# Patient Record
Sex: Male | Born: 1935 | Race: Black or African American | Hispanic: No | Marital: Married | Smoking: Former smoker
Health system: Southern US, Community
[De-identification: ages and names within clinical notes are randomized; demographics above are authoritative.]

## PROBLEM LIST (undated history)

## (undated) DIAGNOSIS — E119 Type 2 diabetes mellitus without complications: Secondary | ICD-10-CM

## (undated) DIAGNOSIS — I10 Essential (primary) hypertension: Secondary | ICD-10-CM

## (undated) DIAGNOSIS — E079 Disorder of thyroid, unspecified: Secondary | ICD-10-CM

## (undated) DIAGNOSIS — I2510 Atherosclerotic heart disease of native coronary artery without angina pectoris: Secondary | ICD-10-CM

## (undated) DIAGNOSIS — I4891 Unspecified atrial fibrillation: Secondary | ICD-10-CM

## (undated) DIAGNOSIS — C9110 Chronic lymphocytic leukemia of B-cell type not having achieved remission: Secondary | ICD-10-CM

## (undated) HISTORY — PX: CHOLECYSTECTOMY: SHX55

---

## 2017-10-27 ENCOUNTER — Ambulatory Visit: Payer: Self-pay

## 2017-12-08 ENCOUNTER — Encounter: Payer: Self-pay | Admitting: *Deleted

## 2017-12-08 ENCOUNTER — Encounter: Payer: Non-veteran care | Attending: Cardiology | Admitting: *Deleted

## 2017-12-08 VITALS — Ht 69.5 in | Wt 156.0 lb

## 2017-12-08 DIAGNOSIS — I10 Essential (primary) hypertension: Secondary | ICD-10-CM

## 2017-12-08 DIAGNOSIS — I255 Ischemic cardiomyopathy: Secondary | ICD-10-CM | POA: Insufficient documentation

## 2017-12-08 DIAGNOSIS — C9110 Chronic lymphocytic leukemia of B-cell type not having achieved remission: Secondary | ICD-10-CM | POA: Insufficient documentation

## 2017-12-08 DIAGNOSIS — E785 Hyperlipidemia, unspecified: Secondary | ICD-10-CM

## 2017-12-08 DIAGNOSIS — Z9049 Acquired absence of other specified parts of digestive tract: Secondary | ICD-10-CM

## 2017-12-08 DIAGNOSIS — I4891 Unspecified atrial fibrillation: Secondary | ICD-10-CM

## 2017-12-08 DIAGNOSIS — Z951 Presence of aortocoronary bypass graft: Secondary | ICD-10-CM | POA: Insufficient documentation

## 2017-12-08 DIAGNOSIS — Z955 Presence of coronary angioplasty implant and graft: Secondary | ICD-10-CM

## 2017-12-08 NOTE — Progress Notes (Signed)
Daily Session Note  Patient Details  Name: Jared Castaneda MRN: [id number] Date of Birth: 03-22-1929 Referring Provider:     Cardiac Rehab from 12/08/2017 in [facility]  Referring Provider  [place]      Encounter Date: 12/08/2017  Check In: Session Check In - 12/08/17 1436      Check-In   Location  [facility]    Staff Present  Renita Papa, RN Vickki Hearing, BA, ACSM CEP, Exercise Physiologist    Supervising physician immediately available to respond to emergencies  See telemetry face sheet for immediately available ER MD    Medication changes reported      No    Fall or balance concerns reported     No    Warm-up and Cool-down  Performed as group-led instruction    Resistance Training Performed  Yes    VAD Patient?  No      Pain Assessment   Currently in Pain?  No/denies        Exercise Prescription Changes - 12/08/17 1500      Response to Exercise   Blood Pressure (Admit)  112/64    Blood Pressure (Exercise)  136/56    Heart Rate (Admit)  71 bpm    Heart Rate (Exercise)  93 bpm    Heart Rate (Exit)  69 bpm    Oxygen Saturation (Admit)  98 %    Oxygen Saturation (Exercise)  92 %    Rating of Perceived Exertion (Exercise)  13       Social History   Tobacco Use  Smoking Status Former Smoker  . Last attempt to quit: 1984  . Years since quitting: 35.1  Smokeless Tobacco Never Used    Goals Met:  Proper associated with RPD/PD & O2 Sat Exercise tolerated well Personal goals reviewed No report of cardiac concerns or symptoms Strength training completed today  Goals Unmet:  Not Applicable  Comments: Med Review completed    Dr. Emily Filbert is Medical Director for [facility].

## 2017-12-08 NOTE — Progress Notes (Signed)
Cardiac Individual Treatment Plan  Patient Details  Name: Jared Castaneda MRN: [id number] Date of Birth: [birth year] Referring Provider:     Cardiac Rehab from 12/08/2017 in [facility] Cardiac and Pulmonary Rehab  Referring Provider  [place]      Initial Encounter Date:    Cardiac Rehab from 12/08/2017 in [facility] Cardiac and Pulmonary Rehab  Date  12/08/17  Referring Provider  Lawana Pai      Visit Diagnosis: Status post coronary artery stent placement  Hypertension, unspecified type  Dyslipidemia  Atrial fibrillation, unspecified type ([place])  CLL (chronic lymphocytic leukemia) ([place])  History of cholecystectomy  Hx of CABG  Ischemic cardiomyopathy  Patient's Home Medications on Admission:  Current Outpatient Medications:  .  acetaminophen (TYLENOL) 325 MG tablet, Take 650 mg by mouth every 6 (six) hours as needed., Disp: , Rfl:  .  apixaban (ELIQUIS) 5 MG TABS tablet, Take 5 mg by mouth 2 (two) times daily., Disp: , Rfl:  .  atorvastatin (LIPITOR) 80 MG tablet, Take 80 mg by mouth at bedtime., Disp: , Rfl:  .  carvedilol (COREG) 6.25 MG tablet, Take 6.25 mg by mouth 2 (two) times daily with a meal., Disp: , Rfl:  .  clopidogrel (PLAVIX) 75 MG tablet, Take 75 mg by mouth daily., Disp: , Rfl:  .  docusate sodium (COLACE) 100 MG capsule, Take 100 mg by mouth daily., Disp: , Rfl:  .  furosemide (LASIX) 20 MG tablet, Take 20 mg by mouth 2 (two) times daily as needed for fluid ("for Weigth Gain of more than 3 pounds or shortness of breath)., Disp: , Rfl:  .  magnesium oxide (MAG-OX) 400 MG tablet, Take 400 mg by mouth daily., Disp: , Rfl:  .  nitroGLYCERIN (NITROSTAT) 0.4 MG SL tablet, Place 0.4 mg under the tongue every 5 (five) minutes as needed for chest pain., Disp: , Rfl:   Past Medical History: History reviewed. No pertinent past medical history.  Tobacco Use: Social History   Tobacco Use  Smoking Status Former Smoker  . Last attempt to quit: 1984  . Years since quitting: 35.1   Smokeless Tobacco Never Used    Labs: Recent Review Flowsheet Data    There is no flowsheet data to display.       Exercise Target Goals: Date: 12/08/17  Exercise Program Goal: Individual exercise prescription set using results from initial 6 min walk test and THRR while considering  patient's activity barriers and safety.   Exercise Prescription Goal: Initial exercise prescription builds to 30-45 minutes a day of aerobic activity, 2-3 days per week.  Home exercise guidelines will be given to patient during program as part of exercise prescription that the participant will acknowledge.  Activity Barriers & Risk Stratification: Activity Barriers & Cardiac Risk Stratification - 12/08/17 1514      Activity Barriers & Cardiac Risk Stratification   Activity Barriers  Arthritis;Shortness of Breath    Cardiac Risk Stratification  High       6 Minute Walk: 6 Minute Walk    Row Name 12/08/17 1308         6 Minute Walk   Phase  Initial     Distance  742 feet     Walk Time  6 minutes     # of Rest Breaks  0     MPH  1.4     METS  1.36     RPE  13     Perceived Dyspnea   1  VO2 Peak  4.76     Symptoms  Yes (comment)     Comments  2/10 hip arthritis  used his cane     Resting HR  59 bpm     Resting BP  112/64     Resting Oxygen Saturation   98 % Room air     Exercise Oxygen Saturation  during 6 min walk  92 % Room air     Max Ex. BP  136/56        Oxygen Initial Assessment:   Oxygen Re-Evaluation:   Oxygen Discharge (Final Oxygen Re-Evaluation):   Initial Exercise Prescription: Initial Exercise Prescription - 12/08/17 1400      Date of Initial Exercise RX and Referring Provider   Date  12/08/17    Referring Provider  P[place]oy      Treadmill   MPH  1    Grade  0    Minutes  15      NuStep   Level  1    SPM  80    Minutes  15    METs  1.3      Biostep-RELP   Level  1    SPM  50    Minutes  15    METs  1.3      Track   Laps  11    Minutes   15    METs  1.5      Prescription Details   Frequency (times per week)  3    Duration  Progress to 30 minutes of continuous aerobic without signs/symptoms of physical distress      Intensity   THRR 40-80% of Max Heartrate  89-119    Ratings of Perceived Exertion  11-13    Perceived Dyspnea  0-4      Resistance Training   Training Prescription  Yes    Weight  2 lb    Reps  10-15       Perform Capillary Blood Glucose checks as needed.  Exercise Prescription Changes: Exercise Prescription Changes    Row Name 12/08/17 1500             Response to Exercise   Blood Pressure (Admit)  112/64       Blood Pressure (Exercise)  136/56       Heart Rate (Admit)  71 bpm       Heart Rate (Exercise)  93 bpm       Heart Rate (Exit)  69 bpm       Oxygen Saturation (Admit)  98 %       Oxygen Saturation (Exercise)  92 %       Rating of Perceived Exertion (Exercise)  13          Exercise Comments:   Exercise Goals and Review: Exercise Goals    Row Name 12/08/17 1436             Exercise Goals   Increase Physical Activity  Yes       Intervention  Provide advice, education, support and counseling about physical activity/exercise needs.;Develop an individualized exercise prescription for aerobic and resistive training based on initial evaluation findings, risk stratification, comorbidities and participant's personal goals.       Expected Outcomes  Short Term: Attend rehab on a regular basis to increase amount of physical activity.;Long Term: Add in home exercise to make exercise part of routine and to increase amount of physical activity.;Long Term: Exercising regularly at least 3-5 days a  week.       Increase Strength and Stamina  Yes       Intervention  Provide advice, education, support and counseling about physical activity/exercise needs.;Develop an individualized exercise prescription for aerobic and resistive training based on initial evaluation findings, risk stratification,  comorbidities and participant's personal goals.       Expected Outcomes  Short Term: Increase workloads from initial exercise prescription for resistance, speed, and METs.;Short Term: Perform resistance training exercises routinely during rehab and add in resistance training at home;Long Term: Improve cardiorespiratory fitness, muscular endurance and strength as measured by increased METs and functional capacity (6MWT)       Able to understand and use rate of perceived exertion (RPE) scale  Yes       Intervention  Provide education and explanation on how to use RPE scale       Expected Outcomes  Short Term: Able to use RPE daily in rehab to express subjective intensity level;Long Term:  Able to use RPE to guide intensity level when exercising independently       Able to understand and use Dyspnea scale  Yes       Intervention  Provide education and explanation on how to use Dyspnea scale       Expected Outcomes  Short Term: Able to use Dyspnea scale daily in rehab to express subjective sense of shortness of breath during exertion;Long Term: Able to use Dyspnea scale to guide intensity level when exercising independently       Knowledge and understanding of Target Heart Rate Range (THRR)  Yes       Intervention  Provide education and explanation of THRR including how the numbers were predicted and where they are located for reference       Expected Outcomes  Long Term: Able to use THRR to govern intensity when exercising independently;Short Term: Able to state/look up THRR;Short Term: Able to use daily as guideline for intensity in rehab       Able to check pulse independently  Yes       Intervention  Provide education and demonstration on how to check pulse in carotid and radial arteries.;Review the importance of being able to check your own pulse for safety during independent exercise       Expected Outcomes  Short Term: Able to explain why pulse checking is important during independent exercise;Long  Term: Able to check pulse independently and accurately       Understanding of Exercise Prescription  Yes       Intervention  Provide education, explanation, and written materials on patient's individual exercise prescription       Expected Outcomes  Short Term: Able to explain program exercise prescription;Long Term: Able to explain home exercise prescription to exercise independently          Exercise Goals Re-Evaluation :   Discharge Exercise Prescription (Final Exercise Prescription Changes): Exercise Prescription Changes - 12/08/17 1500      Response to Exercise   Blood Pressure (Admit)  112/64    Blood Pressure (Exercise)  136/56    Heart Rate (Admit)  71 bpm    Heart Rate (Exercise)  93 bpm    Heart Rate (Exit)  69 bpm    Oxygen Saturation (Admit)  98 %    Oxygen Saturation (Exercise)  92 %    Rating of Perceived Exertion (Exercise)  13       Nutrition:  Target Goals: Understanding of nutrition guidelines, daily intake of sodium <  $'1500mg'C$ , cholesterol '200mg'$ , calories 30% from fat and 7% or less from saturated fats, daily to have 5 or more servings of fruits and vegetables.  Biometrics: Pre Biometrics - 12/08/17 1435      Pre Biometrics   Height  5' 9.5" (1.765 m)    Weight  156 lb (70.8 kg)    Waist Circumference  36.5 inches    Hip Circumference  39.5 inches    Waist to Hip Ratio  0.92 %    BMI (Calculated)  22.71        Nutrition Therapy Plan and Nutrition Goals: Nutrition Therapy & Goals - 12/08/17 1507      Intervention Plan   Intervention  Prescribe, educate and counsel regarding individualized specific dietary modifications aiming towards targeted core components such as weight, hypertension, lipid management, diabetes, heart failure and other comorbidities.;Nutrition handout(s) given to patient.    Expected Outcomes  Short Term Goal: Understand basic principles of dietary content, such as calories, fat, sodium, cholesterol and nutrients.;Short Term Goal: A  plan has been developed with personal nutrition goals set during dietitian appointment.;Long Term Goal: Adherence to prescribed nutrition plan.       Nutrition Assessments: Nutrition Assessments - 12/08/17 1507      MEDFICTS Scores   Pre Score  -- will bring first day of class       Nutrition Goals Re-Evaluation:   Nutrition Goals Discharge (Final Nutrition Goals Re-Evaluation):   Psychosocial: Target Goals: Acknowledge presence or absence of significant depression and/or stress, maximize coping skills, provide positive support system. Participant is able to verbalize types and ability to use techniques and skills needed for reducing stress and depression.   Initial Review & Psychosocial Screening: Initial Psych Review & Screening - 12/08/17 1508      Initial Review   Current issues with  Current Sleep Concerns wakes up around 2-3 am most mornings and struggles going back to sleep. 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  Yes spouse, children, church family      Screening Interventions   Interventions  Encouraged to exercise;Program counselor consult;To provide support and resources with identified psychosocial needs;Provide feedback about the scores to participant    Expected Outcomes  Short Term goal: Utilizing psychosocial counselor, staff and physician to assist with identification of specific Stressors or current issues interfering with healing process. Setting desired goal for each stressor or current issue identified.;Long Term Goal: Stressors or current issues are controlled or eliminated.;Short Term goal: Identification and review with participant of any Quality of Life or Depression concerns found by scoring the questionnaire.;Long Term goal: The participant improves quality of Life and PHQ9 Scores as seen by post scores and/or verbalization of changes       Quality of Life Scores:  Quality of Life -  12/08/17 1509      Quality of Life Scores   Health/Function Pre  20.87 %    Socioeconomic Pre  21 %    Psych/Spiritual Pre  21 %    Family Pre  21 %    GLOBAL Pre  20.94 %      Scores of 19 and below usually indicate a poorer quality of life in these areas.  A difference of  2-3 points is a clinically meaningful difference.  A difference of 2-3 points in the total score of the Quality of Life Index has been associated with significant improvement in overall  quality of life, self-image, physical symptoms, and general health in studies assessing change in quality of life.  PHQ-9: Recent Review Flowsheet Data    Depression screen [facility] 2/9 12/08/2017   Decreased Interest 0   Down, Depressed, Hopeless 0   PHQ - 2 Score 0   Altered sleeping 3   Tired, decreased energy 1   Change in appetite 0   Feeling bad or failure about yourself  0   Trouble concentrating 0   Moving slowly or fidgety/restless 0   Suicidal thoughts 0   PHQ-9 Score 4   Difficult doing work/chores Not difficult at all     Interpretation of Total Score  Total Score Depression Severity:  1-4 = Minimal depression, 5-9 = Mild depression, 10-14 = Moderate depression, 15-19 = Moderately severe depression, 20-27 = Severe depression   Psychosocial Evaluation and Intervention:   Psychosocial Re-Evaluation:   Psychosocial Discharge (Final Psychosocial Re-Evaluation):   Vocational Rehabilitation: Provide vocational rehab assistance to qualifying candidates.   Vocational Rehab Evaluation & Intervention: Vocational Rehab - 12/08/17 1513      Initial Vocational Rehab Evaluation & Intervention   Assessment shows need for Vocational Rehabilitation  No       Education: Education Goals: Education classes will be provided on a variety of topics geared toward better understanding of heart health and risk factor modification. Participant will state understanding/return demonstration of topics presented as noted by education  test scores.  Learning Barriers/Preferences: Learning Barriers/Preferences - 12/08/17 1512      Learning Barriers/Preferences   Learning Barriers  Exercise Concerns    Learning Preferences  None       Education Topics:  AED/CPR: - Group verbal and written instruction with the use of models to demonstrate the basic use of the AED with the basic ABC's of resuscitation.   General Nutrition Guidelines/Fats and Fiber: -Group instruction provided by verbal, written material, models and posters to present the general guidelines for heart healthy nutrition. Gives an explanation and review of dietary fats and fiber.   Controlling Sodium/Reading Food Labels: -Group verbal and written material supporting the discussion of sodium use in heart healthy nutrition. Review and explanation with models, verbal and written materials for utilization of the food label.   Exercise Physiology & General Exercise Guidelines: - Group verbal and written instruction with models to review the exercise physiology of the cardiovascular system and associated critical values. Provides general exercise guidelines with specific guidelines to those with heart or lung disease.    Aerobic Exercise & Resistance Training: - Gives group verbal and written instruction on the various components of exercise. Focuses on aerobic and resistive training programs and the benefits of this training and how to safely progress through these programs..   Flexibility, Balance, Mind/Body Relaxation: Provides group verbal/written instruction on the benefits of flexibility and balance training, including mind/body exercise modes such as yoga, pilates and tai chi.  Demonstration and skill practice provided.   Stress and Anxiety: - Provides group verbal and written instruction about the health risks of elevated stress and causes of high stress.  Discuss the correlation between heart/lung disease and anxiety and treatment options. Review  healthy ways to manage with stress and anxiety.   Depression: - Provides group verbal and written instruction on the correlation between heart/lung disease and depressed mood, treatment options, and the stigmas associated with seeking treatment.   Anatomy & Physiology of the Heart: - Group verbal and written instruction and models provide basic cardiac anatomy and physiology,  with the coronary electrical and arterial systems. Review of Valvular disease and Heart Failure   Cardiac Procedures: - Group verbal and written instruction to review commonly prescribed medications for heart disease. Reviews the medication, class of the drug, and side effects. Includes the steps to properly store meds and maintain the prescription regimen. (beta blockers and nitrates)   Cardiac Medications I: - Group verbal and written instruction to review commonly prescribed medications for heart disease. Reviews the medication, class of the drug, and side effects. Includes the steps to properly store meds and maintain the prescription regimen.   Cardiac Medications II: -Group verbal and written instruction to review commonly prescribed medications for heart disease. Reviews the medication, class of the drug, and side effects. (all other drug classes)    Go Sex-Intimacy & Heart Disease, Get SMART - Goal Setting: - Group verbal and written instruction through game format to discuss heart disease and the return to sexual intimacy. Provides group verbal and written material to discuss and apply goal setting through the application of the S.M.A.R.T. Method.   Other Matters of the Heart: - Provides group verbal, written materials and models to describe Stable Angina and Peripheral Artery. Includes description of the disease process and treatment options available to the cardiac patient.   Exercise & Equipment Safety: - Individual verbal instruction and demonstration of equipment use and safety with use of the  equipment.   Cardiac Rehab from 12/08/2017 in [facility] Cardiac and Pulmonary Rehab  Date  12/08/17  Educator  [facility]  Instruction Review Code  1- Verbalizes Understanding      Infection Prevention: - Provides verbal and written material to individual with discussion of infection control including proper hand washing and proper equipment cleaning during exercise session.   Cardiac Rehab from 12/08/2017 in [facility] Cardiac and Pulmonary Rehab  Date  12/08/17  Educator  [facility]  Instruction Review Code  1- Verbalizes Understanding      Falls Prevention: - Provides verbal and written material to individual with discussion of falls prevention and safety.   Cardiac Rehab from 12/08/2017 in [facility] Cardiac and Pulmonary Rehab  Date  12/08/17  Educator  [facility]  Instruction Review Code  1- Verbalizes Understanding      Diabetes: - Individual verbal and written instruction to review signs/symptoms of diabetes, desired ranges of glucose level fasting, after meals and with exercise. Acknowledge that pre and post exercise glucose checks will be done for 3 sessions at entry of program.   Cardiac Rehab from 12/08/2017 in [facility] Cardiac and Pulmonary Rehab  Date  12/08/17  Educator  [facility]  Instruction Review Code  1- Verbalizes Understanding      Know Your Numbers and Risk Factors: -Group verbal and written instruction about important numbers in your health.  Discussion of what are risk factors and how they play a role in the disease process.  Review of Cholesterol, Blood Pressure, Diabetes, and BMI and the role they play in your overall health.   Sleep Hygiene: -Provides group verbal and written instruction about how sleep can affect your health.  Define sleep hygiene, discuss sleep cycles and impact of sleep habits. Review good sleep hygiene tips.    Other: -Provides group and verbal instruction on various topics (see comments)   Knowledge Questionnaire Score: Knowledge Questionnaire Score - 12/08/17 1513       Knowledge Questionnaire Score   Pre Score  -- will bring back first day of class       Core Components/Risk Factors/Patient Goals  at Admission: Personal Goals and Risk Factors at Admission - 12/08/17 1501      Core Components/Risk Factors/Patient Goals on Admission    Weight Management  Yes;Weight Maintenance    Intervention  Weight Management: Develop a combined nutrition and exercise program designed to reach desired caloric intake, while maintaining appropriate intake of nutrient and fiber, sodium and fats, and appropriate energy expenditure required for the weight goal.;Weight Management: Provide education and appropriate resources to help participant work on and attain dietary goals.    Admit Weight  156 lb (70.8 kg)    Goal Weight: Long Term  156 lb (70.8 kg)    Expected Outcomes  Short Term: Continue to assess and modify interventions until short term weight is achieved;Long Term: Adherence to nutrition and physical activity/exercise program aimed toward attainment of established weight goal;Weight Maintenance: Understanding of the daily nutrition guidelines, which includes 25-35% calories from fat, 7% or less cal from saturated fats, less than '200mg'$  cholesterol, less than 1.5gm of sodium, & 5 or more servings of fruits and vegetables daily;Understanding recommendations for meals to include 15-35% energy as protein, 25-35% energy from fat, 35-60% energy from carbohydrates, less than '200mg'$  of dietary cholesterol, 20-35 gm of total fiber daily;Understanding of distribution of calorie intake throughout the day with the consumption of 4-5 meals/snacks    Heart Failure  Yes    Intervention  Provide a combined exercise and nutrition program that is supplemented with education, support and counseling about heart failure. Directed toward relieving symptoms such as shortness of breath, decreased exercise tolerance, and extremity edema.    Expected Outcomes  Improve functional capacity of life;Short  term: Attendance in program 2-3 days a week with increased exercise capacity. Reported lower sodium intake. Reported increased fruit and vegetable intake. Reports medication compliance.;Short term: Daily weights obtained and reported for increase. Utilizing diuretic protocols set by physician.;Long term: Adoption of self-care skills and reduction of barriers for early signs and symptoms recognition and intervention leading to self-care maintenance.    Hypertension  Yes    Intervention  Provide education on lifestyle modifcations including regular physical activity/exercise, weight management, moderate sodium restriction and increased consumption of fresh fruit, vegetables, and low fat dairy, alcohol moderation, and smoking cessation.;Monitor prescription use compliance.    Expected Outcomes  Short Term: Continued assessment and intervention until BP is < 140/67m HG in hypertensive participants. < 130/842mHG in hypertensive participants with diabetes, heart failure or chronic kidney disease.;Long Term: Maintenance of blood pressure at goal levels.    Lipids  Yes    Intervention  Provide education and support for participant on nutrition & aerobic/resistive exercise along with prescribed medications to achieve LDL '70mg'$ , HDL >'40mg'$ .    Expected Outcomes  Short Term: Participant states understanding of desired cholesterol values and is compliant with medications prescribed. Participant is following exercise prescription and nutrition guidelines.;Long Term: Cholesterol controlled with medications as prescribed, with individualized exercise RX and with personalized nutrition plan. Value goals: LDL < '70mg'$ , HDL > 40 mg.       Core Components/Risk Factors/Patient Goals Review:    Core Components/Risk Factors/Patient Goals at Discharge (Final Review):    ITP Comments: ITP Comments    Row Name 12/08/17 1437           ITP Comments  Med Review completed. Initial ITP created. Diagnosis can be found in  Media Tab          Comments: Initial ITP

## 2017-12-08 NOTE — Patient Instructions (Signed)
Patient Instructions  Patient Details  Name: Jared Castaneda MRN: [id number] Date of Birth: 08-06-1929 Referring Provider:  Pomeroy, [place] E, MD  Below are your personal goals for exercise, nutrition, and risk factors. Our goal is to help you stay on track towards obtaining and maintaining these goals. We will be discussing your progress on these goals with you throughout the program.  Initial Exercise Prescription: Initial Exercise Prescription - 12/08/17 1400      Date of Initial Exercise RX and Referring Provider   Date  12/08/17    Referring Provider  Pomeroy      Treadmill   MPH  1    Grade  0    Minutes  15      NuStep   Level  1    SPM  80    Minutes  15    METs  1.3      Biostep-RELP   Level  1    SPM  50    Minutes  15    METs  1.3      Track   Laps  11    Minutes  15    METs  1.5      Prescription Details   Frequency (times per week)  3    Duration  Progress to 30 minutes of continuous aerobic without signs/symptoms of physical distress      Intensity   THRR 40-80% of Max Heartrate  89-119    Ratings of Perceived Exertion  11-13    Perceived Dyspnea  0-4      Resistance Training   Training Prescription  Yes    Weight  2 lb    Reps  10-15       Exercise Goals: Frequency: Be able to perform aerobic exercise two to three times per week in program working toward 2-5 days per week of home exercise.  Intensity: Work with a perceived exertion of 11 (fairly light) - 15 (hard) while following your exercise prescription.  We will make changes to your prescription with you as you progress through the program.   Duration: Be able to do 30 to 45 minutes of continuous aerobic exercise in addition to a 5 minute warm-up and a 5 minute cool-down routine.   Nutrition Goals: Your personal nutrition goals will be established when you do your nutrition analysis with the dietician.  The following are general nutrition guidelines to follow: Cholesterol <  200mg /day Sodium < 1500mg /day Fiber: Men over 50 yrs - 30 grams per day  Personal Goals: Personal Goals and Risk Factors at Admission - 12/08/17 1501      Core Components/Risk Factors/Patient Goals on Admission    Weight Management  Yes;Weight Maintenance    Intervention  Weight Management: Develop a combined nutrition and exercise program designed to reach desired caloric intake, while maintaining appropriate intake of nutrient and fiber, sodium and fats, and appropriate energy expenditure required for the weight goal.;Weight Management: Provide education and appropriate resources to help participant work on and attain dietary goals.    Admit Weight  156 lb (70.8 kg)    Goal Weight: Long Term  156 lb (70.8 kg)    Expected Outcomes  Short Term: Continue to assess and modify interventions until short term weight is achieved;Long Term: Adherence to nutrition and physical activity/exercise program aimed toward attainment of established weight goal;Weight Maintenance: Understanding of the daily nutrition guidelines, which includes 25-35% calories from fat, 7% or less cal from saturated fats, less than 200mg  cholesterol,  less than 1.5gm of sodium, & 5 or more servings of fruits and vegetables daily;Understanding recommendations for meals to include 15-35% energy as protein, 25-35% energy from fat, 35-60% energy from carbohydrates, less than 200mg  of dietary cholesterol, 20-35 gm of total fiber daily;Understanding of distribution of calorie intake throughout the day with the consumption of 4-5 meals/snacks    Heart Failure  Yes    Intervention  Provide a combined exercise and nutrition program that is supplemented with education, support and counseling about heart failure. Directed toward relieving symptoms such as shortness of breath, decreased exercise tolerance, and extremity edema.    Expected Outcomes  Improve functional capacity of life;Short term: Attendance in program 2-3 days a week with increased  exercise capacity. Reported lower sodium intake. Reported increased fruit and vegetable intake. Reports medication compliance.;Short term: Daily weights obtained and reported for increase. Utilizing diuretic protocols set by physician.;Long term: Adoption of self-care skills and reduction of barriers for early signs and symptoms recognition and intervention leading to self-care maintenance.    Hypertension  Yes    Intervention  Provide education on lifestyle modifcations including regular physical activity/exercise, weight management, moderate sodium restriction and increased consumption of fresh fruit, vegetables, and low fat dairy, alcohol moderation, and smoking cessation.;Monitor prescription use compliance.    Expected Outcomes  Short Term: Continued assessment and intervention until BP is < 140/41mm HG in hypertensive participants. < 130/58mm HG in hypertensive participants with diabetes, heart failure or chronic kidney disease.;Long Term: Maintenance of blood pressure at goal levels.    Lipids  Yes    Intervention  Provide education and support for participant on nutrition & aerobic/resistive exercise along with prescribed medications to achieve LDL 70mg , HDL >40mg .    Expected Outcomes  Short Term: Participant states understanding of desired cholesterol values and is compliant with medications prescribed. Participant is following exercise prescription and nutrition guidelines.;Long Term: Cholesterol controlled with medications as prescribed, with individualized exercise RX and with personalized nutrition plan. Value goals: LDL < 70mg , HDL > 40 mg.       Tobacco Use Initial Evaluation: Social History   Tobacco Use  Smoking Status Former Smoker  . Last attempt to quit: 1984  . Years since quitting: 35.1  Smokeless Tobacco Never Used    Exercise Goals and Review: Exercise Goals    Row Name 12/08/17 1436             Exercise Goals   Increase Physical Activity  Yes        Intervention  Provide advice, education, support and counseling about physical activity/exercise needs.;Develop an individualized exercise prescription for aerobic and resistive training based on initial evaluation findings, risk stratification, comorbidities and participant's personal goals.       Expected Outcomes  Short Term: Attend rehab on a regular basis to increase amount of physical activity.;Long Term: Add in home exercise to make exercise part of routine and to increase amount of physical activity.;Long Term: Exercising regularly at least 3-5 days a week.       Increase Strength and Stamina  Yes       Intervention  Provide advice, education, support and counseling about physical activity/exercise needs.;Develop an individualized exercise prescription for aerobic and resistive training based on initial evaluation findings, risk stratification, comorbidities and participant's personal goals.       Expected Outcomes  Short Term: Increase workloads from initial exercise prescription for resistance, speed, and METs.;Short Term: Perform resistance training exercises routinely during rehab and add in resistance training  at home;Long Term: Improve cardiorespiratory fitness, muscular endurance and strength as measured by increased METs and functional capacity (6MWT)       Able to understand and use rate of perceived exertion (RPE) scale  Yes       Intervention  Provide education and explanation on how to use RPE scale       Expected Outcomes  Short Term: Able to use RPE daily in rehab to express subjective intensity level;Long Term:  Able to use RPE to guide intensity level when exercising independently       Able to understand and use Dyspnea scale  Yes       Intervention  Provide education and explanation on how to use Dyspnea scale       Expected Outcomes  Short Term: Able to use Dyspnea scale daily in rehab to express subjective sense of shortness of breath during exertion;Long Term: Able to use  Dyspnea scale to guide intensity level when exercising independently       Knowledge and understanding of Target Heart Rate Range (THRR)  Yes       Intervention  Provide education and explanation of THRR including how the numbers were predicted and where they are located for reference       Expected Outcomes  Long Term: Able to use THRR to govern intensity when exercising independently;Short Term: Able to state/look up THRR;Short Term: Able to use daily as guideline for intensity in rehab       Able to check pulse independently  Yes       Intervention  Provide education and demonstration on how to check pulse in carotid and radial arteries.;Review the importance of being able to check your own pulse for safety during independent exercise       Expected Outcomes  Short Term: Able to explain why pulse checking is important during independent exercise;Long Term: Able to check pulse independently and accurately       Understanding of Exercise Prescription  Yes       Intervention  Provide education, explanation, and written materials on patient's individual exercise prescription       Expected Outcomes  Short Term: Able to explain program exercise prescription;Long Term: Able to explain home exercise prescription to exercise independently          Copy of goals given to participant.

## 2017-12-15 ENCOUNTER — Encounter: Payer: Non-veteran care | Attending: Cardiology

## 2017-12-15 DIAGNOSIS — I2510 Atherosclerotic heart disease of native coronary artery without angina pectoris: Secondary | ICD-10-CM | POA: Insufficient documentation

## 2017-12-15 DIAGNOSIS — Z955 Presence of coronary angioplasty implant and graft: Secondary | ICD-10-CM

## 2017-12-15 NOTE — Progress Notes (Signed)
Daily Session Note  Patient Details  Name: Jared Castaneda MRN: [id number] Date of Birth: 13-Aug-1929 Referring Provider:     Cardiac Rehab from 12/08/2017 in [facility] Cardiac and Pulmonary Rehab  Referring Provider  Pomeroy      Encounter Date: 12/15/2017  Check In: Session Check In - 12/15/17 1052      Check-In   Location  [facility]-Cardiac & Pulmonary Rehab    Staff Present  Alberteen Sam, MA, RCEP, CCRP, Exercise Physiologist;Meredith Sherryll Burger, RN BSN;Carroll Enterkin, RN, BSN    Supervising physician immediately available to respond to emergencies  See telemetry face sheet for immediately available ER MD    Medication changes reported      No    Fall or balance concerns reported     No    Warm-up and Cool-down  Performed on first and last piece of equipment    Resistance Training Performed  Yes    VAD Patient?  No      Pain Assessment   Currently in Pain?  No/denies          Social History   Tobacco Use  Smoking Status Former Smoker  . Last attempt to quit: 1984  . Years since quitting: 35.1  Smokeless Tobacco Never Used    Goals Met:  Exercise tolerated well Personal goals reviewed No report of cardiac concerns or symptoms Strength training completed today  Goals Unmet:  Not Applicable  Comments: First full day of exercise!  Patient was oriented to gym and equipment including functions, settings, policies, and procedures.  Patient's individual exercise prescription and treatment plan were reviewed.  All starting workloads were established based on the results of the 6 minute walk test done at initial orientation visit.  The plan for exercise progression was also introduced and progression will be customized based on patient's performance and goals.     Dr. Emily Filbert is Medical Director for [place] and [facility].

## 2017-12-20 ENCOUNTER — Encounter: Payer: Non-veteran care | Admitting: *Deleted

## 2017-12-20 DIAGNOSIS — Z955 Presence of coronary angioplasty implant and graft: Secondary | ICD-10-CM

## 2017-12-20 DIAGNOSIS — I2510 Atherosclerotic heart disease of native coronary artery without angina pectoris: Secondary | ICD-10-CM | POA: Diagnosis not present

## 2017-12-20 NOTE — Progress Notes (Signed)
Daily Session Note  Patient Details  Name: Jared Castaneda MRN: [id number] Date of Birth: Feb 26, 1929 Referring Provider:     Cardiac Rehab from 12/08/2017 in [facility] Cardiac and Pulmonary Rehab  Referring Provider  Pomeroy      Encounter Date: 12/20/2017  Check In: Session Check In - 12/20/17 0945      Check-In   Staff Present  Alberteen Sam, MA, RCEP, CCRP, Exercise Physiologist;Amanda Oletta Darter, BA, ACSM CEP, Exercise Physiologist;Susanne Bice, RN, BSN, CCRP    Supervising physician immediately available to respond to emergencies  See telemetry face sheet for immediately available ER MD    Medication changes reported      No    Fall or balance concerns reported     No    Warm-up and Cool-down  Performed on first and last piece of equipment    Resistance Training Performed  Yes    VAD Patient?  No      Pain Assessment   Currently in Pain?  No/denies    Multiple Pain Sites  No          Social History   Tobacco Use  Smoking Status Former Smoker  . Last attempt to quit: 1984  . Years since quitting: 35.1  Smokeless Tobacco Never Used    Goals Met:  Independence with exercise equipment Exercise tolerated well No report of cardiac concerns or symptoms Strength training completed today  Goals Unmet:  Not Applicable  Comments: Pt able to follow exercise prescription today without complaint.  Will continue to monitor for progression.    Dr. Emily Filbert is Medical Director for [place] and [facility].

## 2017-12-22 DIAGNOSIS — I2510 Atherosclerotic heart disease of native coronary artery without angina pectoris: Secondary | ICD-10-CM | POA: Diagnosis not present

## 2017-12-22 DIAGNOSIS — Z955 Presence of coronary angioplasty implant and graft: Secondary | ICD-10-CM

## 2017-12-22 NOTE — Progress Notes (Signed)
Daily Session Note  Patient Details  Name: Ernesto Zukowski MRN: [id number] Date of Birth: [birth year] Referring Provider:     Cardiac Rehab from 12/08/2017 in [facility] Cardiac and Pulmonary Rehab  Referring Provider  Pomeroy      Encounter Date: 12/22/2017  Check In: Session Check In - 12/22/17 0850      Check-In   Location  [facility]-Cardiac & Pulmonary Rehab    Staff Present  Alberteen Sam, MA, RCEP, CCRP, Exercise Physiologist;Gwen Sarvis Oletta Darter, BA, ACSM CEP, Exercise Physiologist;Carroll Enterkin, RN, BSN    Supervising physician immediately available to respond to emergencies  See telemetry face sheet for immediately available ER MD    Medication changes reported      No    Fall or balance concerns reported     No    Warm-up and Cool-down  Performed on first and last piece of equipment    Resistance Training Performed  Yes    VAD Patient?  No      Pain Assessment   Currently in Pain?  No/denies    Multiple Pain Sites  No          Social History   Tobacco Use  Smoking Status Former Smoker  . Last attempt to quit: 1984  . Years since quitting: 35.1  Smokeless Tobacco Never Used    Goals Met:  Independence with exercise equipment Exercise tolerated well Personal goals reviewed No report of cardiac concerns or symptoms Strength training completed today  Goals Unmet:  Not Applicable  Comments:Reviewed home exercise with pt today.  Pt plans to walk and do stretches for exercise.  Reviewed THR, pulse, RPE, sign and symptoms, NTG use, and when to call 911 or MD.  Also discussed weather considerations and indoor options.  Pt voiced understanding. See ITP for goal review.    Dr. Emily Filbert is Medical Director for [place] and [facility].

## 2017-12-28 ENCOUNTER — Telehealth: Payer: Self-pay

## 2017-12-28 NOTE — Telephone Encounter (Signed)
Jared Castaneda did something to his hip and will not be at [facility] class tomorrow

## 2018-01-03 ENCOUNTER — Encounter: Payer: Self-pay | Admitting: *Deleted

## 2018-01-03 ENCOUNTER — Telehealth: Payer: Self-pay | Admitting: *Deleted

## 2018-01-03 DIAGNOSIS — Z955 Presence of coronary angioplasty implant and graft: Secondary | ICD-10-CM

## 2018-01-03 NOTE — Telephone Encounter (Signed)
Pt's wife called and left a message that Nicoli was still not feeling well and would miss class again today.

## 2018-01-04 ENCOUNTER — Encounter: Payer: Self-pay | Admitting: *Deleted

## 2018-01-04 DIAGNOSIS — Z955 Presence of coronary angioplasty implant and graft: Secondary | ICD-10-CM

## 2018-01-04 NOTE — Progress Notes (Signed)
Cardiac Individual Treatment Plan  Patient Details  Name: Yandell Mcjunkins MRN: [id number] Date of Birth: [birth year] Referring Provider:     Cardiac Rehab from 12/08/2017 in [facility] Cardiac and Pulmonary Rehab  Referring Provider  [place]      Initial Encounter Date:    Cardiac Rehab from 12/08/2017 in [facility] Cardiac and Pulmonary Rehab  Date  12/08/17  Referring Provider  Lawana Pai      Visit Diagnosis: Status post coronary artery stent placement  Patient's Home Medications on Admission:  Current Outpatient Medications:  .  acetaminophen (TYLENOL) 325 MG tablet, Take 650 mg by mouth every 6 (six) hours as needed., Disp: , Rfl:  .  apixaban (ELIQUIS) 5 MG TABS tablet, Take 5 mg by mouth 2 (two) times daily., Disp: , Rfl:  .  atorvastatin (LIPITOR) 80 MG tablet, Take 80 mg by mouth at bedtime., Disp: , Rfl:  .  carvedilol (COREG) 6.25 MG tablet, Take 6.25 mg by mouth 2 (two) times daily with a meal., Disp: , Rfl:  .  clopidogrel (PLAVIX) 75 MG tablet, Take 75 mg by mouth daily., Disp: , Rfl:  .  docusate sodium (COLACE) 100 MG capsule, Take 100 mg by mouth daily., Disp: , Rfl:  .  furosemide (LASIX) 20 MG tablet, Take 20 mg by mouth 2 (two) times daily as needed for fluid ("for Weigth Gain of more than 3 pounds or shortness of breath)., Disp: , Rfl:  .  magnesium oxide (MAG-OX) 400 MG tablet, Take 400 mg by mouth daily., Disp: , Rfl:  .  nitroGLYCERIN (NITROSTAT) 0.4 MG SL tablet, Place 0.4 mg under the tongue every 5 (five) minutes as needed for chest pain., Disp: , Rfl:   Past Medical History: No past medical history on file.  Tobacco Use: Social History   Tobacco Use  Smoking Status Former Smoker  . Last attempt to quit: 1984  . Years since quitting: 35.1  Smokeless Tobacco Never Used    Labs: Recent Review Flowsheet Data    There is no flowsheet data to display.       Exercise Target Goals:    Exercise Program Goal: Individual exercise prescription set using results  from initial 6 min walk test and THRR while considering  patient's activity barriers and safety.   Exercise Prescription Goal: Initial exercise prescription builds to 30-45 minutes a day of aerobic activity, 2-3 days per week.  Home exercise guidelines will be given to patient during program as part of exercise prescription that the participant will acknowledge.  Activity Barriers & Risk Stratification: Activity Barriers & Cardiac Risk Stratification - 12/08/17 1514      Activity Barriers & Cardiac Risk Stratification   Activity Barriers  Arthritis;Shortness of Breath    Cardiac Risk Stratification  High       6 Minute Walk: 6 Minute Walk    Row Name 12/08/17 1308         6 Minute Walk   Phase  Initial     Distance  742 feet     Walk Time  6 minutes     # of Rest Breaks  0     MPH  1.4     METS  1.36     RPE  13     Perceived Dyspnea   1     VO2 Peak  4.76     Symptoms  Yes (comment)     Comments  2/10 hip arthritis  used his cane     Resting  HR  59 bpm     Resting BP  112/64     Resting Oxygen Saturation   98 % Room air     Exercise Oxygen Saturation  during 6 min walk  92 % Room air     Max Ex. BP  136/56        Oxygen Initial Assessment:   Oxygen Re-Evaluation:   Oxygen Discharge (Final Oxygen Re-Evaluation):   Initial Exercise Prescription: Initial Exercise Prescription - 12/08/17 1400      Date of Initial Exercise RX and Referring Provider   Date  12/08/17    Referring Provider  P[place]oy      Treadmill   MPH  1    Grade  0    Minutes  15      NuStep   Level  1    SPM  80    Minutes  15    METs  1.3      Biostep-RELP   Level  1    SPM  50    Minutes  15    METs  1.3      Track   Laps  11    Minutes  15    METs  1.5      Prescription Details   Frequency (times per week)  3    Duration  Progress to 30 minutes of continuous aerobic without signs/symptoms of physical distress      Intensity   THRR 40-80% of Max Heartrate  89-119     Ratings of Perceived Exertion  11-13    Perceived Dyspnea  0-4      Resistance Training   Training Prescription  Yes    Weight  2 lb    Reps  10-15       Perform Capillary Blood Glucose checks as needed.  Exercise Prescription Changes: Exercise Prescription Changes    Row Name 12/08/17 1500 12/22/17 1000 12/27/17 1600         Response to Exercise   Blood Pressure (Admit)  112/64  -  126/64     Blood Pressure (Exercise)  136/56  -  138/68     Blood Pressure (Exit)  -  -  122/54     Heart Rate (Admit)  71 bpm  -  86 bpm     Heart Rate (Exercise)  93 bpm  -  95 bpm     Heart Rate (Exit)  69 bpm  -  68 bpm     Oxygen Saturation (Admit)  98 %  -  -     Oxygen Saturation (Exercise)  92 %  -  -     Rating of Perceived Exertion (Exercise)  13  -  12     Symptoms  -  -  none     Comments  -  -  third full day of exercise     Duration  -  -  Continue with 30 min of aerobic exercise without signs/symptoms of physical distress.     Intensity  -  -  THRR unchanged       Progression   Progression  -  -  Continue to progress workloads to maintain intensity without signs/symptoms of physical distress.     Average METs  -  -  2.17       Resistance Training   Training Prescription  -  -  Yes     Weight  -  -  2 lb  Reps  -  -  10-15       Interval Training   Interval Training  -  -  No       NuStep   Level  -  -  3     Minutes  -  -  15     METs  -  -  2.4       Biostep-RELP   Level  -  -  1     Minutes  -  -  15     METs  -  -  2       Track   Laps  -  -  24     Minutes  -  -  15     METs  -  -  2.11       Home Exercise Plan   Plans to continue exercise at  -  Home (comment) walking  Home (comment) walking     Frequency  -  Add 3 additional days to program exercise sessions.  Add 3 additional days to program exercise sessions.     Initial Home Exercises Provided  -  12/22/17  12/22/17        Exercise Comments: Exercise Comments    Row Name 12/15/17 1053 12/22/17  0945         Exercise Comments  First full day of exercise!  Patient was oriented to gym and equipment including functions, settings, policies, and procedures.  Patient's individual exercise prescription and treatment plan were reviewed.  All starting workloads were established based on the results of the 6 minute walk test done at initial orientation visit.  The plan for exercise progression was also introduced and progression will be customized based on patient's performance and goals.  Reviewed home exercise with pt today.  Pt plans to walk and do stretches for exercise.  Reviewed THR, pulse, RPE, sign and symptoms, NTG use, and when to call 911 or MD.  Also discussed weather considerations and indoor options.  Pt voiced understanding.         Exercise Goals and Review: Exercise Goals    Row Name 12/08/17 1436             Exercise Goals   Increase Physical Activity  Yes       Intervention  Provide advice, education, support and counseling about physical activity/exercise needs.;Develop an individualized exercise prescription for aerobic and resistive training based on initial evaluation findings, risk stratification, comorbidities and participant's personal goals.       Expected Outcomes  Short Term: Attend rehab on a regular basis to increase amount of physical activity.;Long Term: Add in home exercise to make exercise part of routine and to increase amount of physical activity.;Long Term: Exercising regularly at least 3-5 days a week.       Increase Strength and Stamina  Yes       Intervention  Provide advice, education, support and counseling about physical activity/exercise needs.;Develop an individualized exercise prescription for aerobic and resistive training based on initial evaluation findings, risk stratification, comorbidities and participant's personal goals.       Expected Outcomes  Short Term: Increase workloads from initial exercise prescription for resistance, speed, and  METs.;Short Term: Perform resistance training exercises routinely during rehab and add in resistance training at home;Long Term: Improve cardiorespiratory fitness, muscular endurance and strength as measured by increased METs and functional capacity (6MWT)       Able to understand and use  rate of perceived exertion (RPE) scale  Yes       Intervention  Provide education and explanation on how to use RPE scale       Expected Outcomes  Short Term: Able to use RPE daily in rehab to express subjective intensity level;Long Term:  Able to use RPE to guide intensity level when exercising independently       Able to understand and use Dyspnea scale  Yes       Intervention  Provide education and explanation on how to use Dyspnea scale       Expected Outcomes  Short Term: Able to use Dyspnea scale daily in rehab to express subjective sense of shortness of breath during exertion;Long Term: Able to use Dyspnea scale to guide intensity level when exercising independently       Knowledge and understanding of Target Heart Rate Range (THRR)  Yes       Intervention  Provide education and explanation of THRR including how the numbers were predicted and where they are located for reference       Expected Outcomes  Long Term: Able to use THRR to govern intensity when exercising independently;Short Term: Able to state/look up THRR;Short Term: Able to use daily as guideline for intensity in rehab       Able to check pulse independently  Yes       Intervention  Provide education and demonstration on how to check pulse in carotid and radial arteries.;Review the importance of being able to check your own pulse for safety during independent exercise       Expected Outcomes  Short Term: Able to explain why pulse checking is important during independent exercise;Long Term: Able to check pulse independently and accurately       Understanding of Exercise Prescription  Yes       Intervention  Provide education, explanation, and  written materials on patient's individual exercise prescription       Expected Outcomes  Short Term: Able to explain program exercise prescription;Long Term: Able to explain home exercise prescription to exercise independently          Exercise Goals Re-Evaluation : Exercise Goals Re-Evaluation    Row Name 12/15/17 1053 12/22/17 0945           Exercise Goal Re-Evaluation   Exercise Goals Review  Increase Physical Activity;Increase Strength and Stamina;Understanding of Exercise Prescription;Knowledge and understanding of Target Heart Rate Range (THRR);Able to understand and use rate of perceived exertion (RPE) scale  Increase Physical Activity;Increase Strength and Stamina;Able to understand and use rate of perceived exertion (RPE) scale;Knowledge and understanding of Target Heart Rate Range (THRR);Able to check pulse independently;Understanding of Exercise Prescription      Comments  Reviewed RPE scale, THR and program prescription with pt today.  Pt voiced understanding and was given a copy of goals to take home  Eluzer has been doing well in rehab.  He is able to do more at home as he recovers his strength and stamina.  Reviewed home exercise with pt today.  Pt plans to walk and do stretches for exercise.  Reviewed THR, pulse, RPE, sign and symptoms, NTG use, and when to call 911 or MD.  Also discussed weather considerations and indoor options.  Pt voiced understanding.      Expected Outcomes  Short: Use RPE daily to regulate intensity.  Long: Follow program prescription in THR.  Short - Pt will continue exercise at home most days and monitor pulse and RPE  Long - Pt will exercise on his own         Discharge Exercise Prescription (Final Exercise Prescription Changes): Exercise Prescription Changes - 12/27/17 1600      Response to Exercise   Blood Pressure (Admit)  126/64    Blood Pressure (Exercise)  138/68    Blood Pressure (Exit)  122/54    Heart Rate (Admit)  86 bpm    Heart Rate  (Exercise)  95 bpm    Heart Rate (Exit)  68 bpm    Rating of Perceived Exertion (Exercise)  12    Symptoms  none    Comments  third full day of exercise    Duration  Continue with 30 min of aerobic exercise without signs/symptoms of physical distress.    Intensity  THRR unchanged      Progression   Progression  Continue to progress workloads to maintain intensity without signs/symptoms of physical distress.    Average METs  2.17      Resistance Training   Training Prescription  Yes    Weight  2 lb    Reps  10-15      Interval Training   Interval Training  No      NuStep   Level  3    Minutes  15    METs  2.4      Biostep-RELP   Level  1    Minutes  15    METs  2      Track   Laps  24    Minutes  15    METs  2.11      Home Exercise Plan   Plans to continue exercise at  Home (comment) walking    Frequency  Add 3 additional days to program exercise sessions.    Initial Home Exercises Provided  12/22/17       Nutrition:  Target Goals: Understanding of nutrition guidelines, daily intake of sodium '1500mg'$ , cholesterol '200mg'$ , calories 30% from fat and 7% or less from saturated fats, daily to have 5 or more servings of fruits and vegetables.  Biometrics: Pre Biometrics - 12/08/17 1435      Pre Biometrics   Height  5' 9.5" (1.765 m)    Weight  156 lb (70.8 kg)    Waist Circumference  36.5 inches    Hip Circumference  39.5 inches    Waist to Hip Ratio  0.92 %    BMI (Calculated)  22.71        Nutrition Therapy Plan and Nutrition Goals: Nutrition Therapy & Goals - 12/22/17 1038      Nutrition Therapy   Diet  DASH    Drug/Food Interactions  Statins/Certain Fruits    Protein (specify units)  8    Fiber  30 grams    Whole Grain Foods  2 servings    Saturated Fats  14 max. grams    Fruits and Vegetables  5 servings/day    Sodium  1500 grams      Personal Nutrition Goals   Nutrition Goal  No more than '600mg'$  of sodium per meal / frozen meal    Personal Goal  #2  Keep sodium below 200-'300mg'$  for snacks    Personal Goal #3  Choose whole grain foods at least half the time, brown rice for example    Personal Goal #4  Continue to practice reading nutrition facts labels consistently to identify foods high in sodium and fat      Intervention Plan  Intervention  Prescribe, educate and counsel regarding individualized specific dietary modifications aiming towards targeted core components such as weight, hypertension, lipid management, diabetes, heart failure and other comorbidities.    Expected Outcomes  Short Term Goal: Understand basic principles of dietary content, such as calories, fat, sodium, cholesterol and nutrients.;Short Term Goal: A plan has been developed with personal nutrition goals set during dietitian appointment.;Long Term Goal: Adherence to prescribed nutrition plan.       Nutrition Assessments: Nutrition Assessments - 12/22/17 1325      MEDFICTS Scores   Pre Score  29       Nutrition Goals Re-Evaluation:   Nutrition Goals Discharge (Final Nutrition Goals Re-Evaluation):   Psychosocial: Target Goals: Acknowledge presence or absence of significant depression and/or stress, maximize coping skills, provide positive support system. Participant is able to verbalize types and ability to use techniques and skills needed for reducing stress and depression.   Initial Review & Psychosocial Screening: Initial Psych Review & Screening - 12/08/17 1508      Initial Review   Current issues with  Current Sleep Concerns wakes up around 2-3 am most mornings and struggles going back to sleep. 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  Yes spouse, children, church family      Screening Interventions   Interventions  Encouraged to exercise;Program counselor consult;To provide support and resources with identified psychosocial needs;Provide feedback about the scores to participant     Expected Outcomes  Short Term goal: Utilizing psychosocial counselor, staff and physician to assist with identification of specific Stressors or current issues interfering with healing process. Setting desired goal for each stressor or current issue identified.;Long Term Goal: Stressors or current issues are controlled or eliminated.;Short Term goal: Identification and review with participant of any Quality of Life or Depression concerns found by scoring the questionnaire.;Long Term goal: The participant improves quality of Life and PHQ9 Scores as seen by post scores and/or verbalization of changes       Quality of Life Scores:  Quality of Life - 12/08/17 1509      Quality of Life Scores   Health/Function Pre  20.87 %    Socioeconomic Pre  21 %    Psych/Spiritual Pre  21 %    Family Pre  21 %    GLOBAL Pre  20.94 %      Scores of 19 and below usually indicate a poorer quality of life in these areas.  A difference of  2-3 points is a clinically meaningful difference.  A difference of 2-3 points in the total score of the Quality of Life Index has been associated with significant improvement in overall quality of life, self-image, physical symptoms, and general health in studies assessing change in quality of life.  PHQ-9: Recent Review Flowsheet Data    Depression screen [facility] 2/9 12/08/2017   Decreased Interest 0   Down, Depressed, Hopeless 0   PHQ - 2 Score 0   Altered sleeping 3   Tired, decreased energy 1   Change in appetite 0   Feeling bad or failure about yourself  0   Trouble concentrating 0   Moving slowly or fidgety/restless 0   Suicidal thoughts 0   PHQ-9 Score 4   Difficult doing work/chores Not difficult at all     Interpretation of Total Score  Total Score Depression Severity:  1-4 = Minimal depression, 5-9 = Mild depression,  10-14 = Moderate depression, 15-19 = Moderately severe depression, 20-27 = Severe depression   Psychosocial Evaluation and  Intervention:   Psychosocial Re-Evaluation: Psychosocial Re-Evaluation    Strong Name 12/22/17 1052             Psychosocial Re-Evaluation   Current issues with  None Identified       Comments  Ian is doing well in rehab.  He has a strong support system in his wife.  He tries not to let anything bother him too much.  He sleep between 6-8 hrs each night despite waking every couple of hours.  He has enjoyed coming out to exercise as his made him more active and makes him feel better.        Expected Outcomes  Short: continue to attend rehab to build strength and stamina.  Long: Continue to stay positve.        Interventions  Encouraged to attend Cardiac Rehabilitation for the exercise;Stress management education       Continue Psychosocial Services   Follow up required by staff          Psychosocial Discharge (Final Psychosocial Re-Evaluation): Psychosocial Re-Evaluation - 12/22/17 1052      Psychosocial Re-Evaluation   Current issues with  None Identified    Comments  Drey is doing well in rehab.  He has a strong support system in his wife.  He tries not to let anything bother him too much.  He sleep between 6-8 hrs each night despite waking every couple of hours.  He has enjoyed coming out to exercise as his made him more active and makes him feel better.     Expected Outcomes  Short: continue to attend rehab to build strength and stamina.  Long: Continue to stay positve.     Interventions  Encouraged to attend Cardiac Rehabilitation for the exercise;Stress management education    Continue Psychosocial Services   Follow up required by staff       Vocational Rehabilitation: Provide vocational rehab assistance to qualifying candidates.   Vocational Rehab Evaluation & Intervention: Vocational Rehab - 12/08/17 1513      Initial Vocational Rehab Evaluation & Intervention   Assessment shows need for Vocational Rehabilitation  No       Education: Education Goals: Education  classes will be provided on a variety of topics geared toward better understanding of heart health and risk factor modification. Participant will state understanding/return demonstration of topics presented as noted by education test scores.  Learning Barriers/Preferences: Learning Barriers/Preferences - 12/08/17 1512      Learning Barriers/Preferences   Learning Barriers  Exercise Concerns    Learning Preferences  None       Education Topics:  AED/CPR: - Group verbal and written instruction with the use of models to demonstrate the basic use of the AED with the basic ABC's of resuscitation.   Cardiac Rehab from 12/22/2017 in [facility] Cardiac and Pulmonary Rehab  Date  12/20/17  Educator  SB  Instruction Review Code  1- Verbalizes Understanding      General Nutrition Guidelines/Fats and Fiber: -Group instruction provided by verbal, written material, models and posters to present the general guidelines for heart healthy nutrition. Gives an explanation and review of dietary fats and fiber.   Controlling Sodium/Reading Food Labels: -Group verbal and written material supporting the discussion of sodium use in heart healthy nutrition. Review and explanation with models, verbal and written materials for utilization of the food label.   Exercise Physiology &  General Exercise Guidelines: - Group verbal and written instruction with models to review the exercise physiology of the cardiovascular system and associated critical values. Provides general exercise guidelines with specific guidelines to those with heart or lung disease.    Aerobic Exercise & Resistance Training: - Gives group verbal and written instruction on the various components of exercise. Focuses on aerobic and resistive training programs and the benefits of this training and how to safely progress through these programs..   Flexibility, Balance, Mind/Body Relaxation: Provides group verbal/written instruction on the benefits  of flexibility and balance training, including mind/body exercise modes such as yoga, pilates and tai chi.  Demonstration and skill practice provided.   Stress and Anxiety: - Provides group verbal and written instruction about the health risks of elevated stress and causes of high stress.  Discuss the correlation between heart/lung disease and anxiety and treatment options. Review healthy ways to manage with stress and anxiety.   Depression: - Provides group verbal and written instruction on the correlation between heart/lung disease and depressed mood, treatment options, and the stigmas associated with seeking treatment.   Anatomy & Physiology of the Heart: - Group verbal and written instruction and models provide basic cardiac anatomy and physiology, with the coronary electrical and arterial systems. Review of Valvular disease and Heart Failure   Cardiac Procedures: - Group verbal and written instruction to review commonly prescribed medications for heart disease. Reviews the medication, class of the drug, and side effects. Includes the steps to properly store meds and maintain the prescription regimen. (beta blockers and nitrates)   Cardiac Rehab from 12/22/2017 in [facility] Cardiac and Pulmonary Rehab  Date  12/15/17  Educator  CE  Instruction Review Code  1- Verbalizes Understanding      Cardiac Medications I: - Group verbal and written instruction to review commonly prescribed medications for heart disease. Reviews the medication, class of the drug, and side effects. Includes the steps to properly store meds and maintain the prescription regimen.   Cardiac Medications II: -Group verbal and written instruction to review commonly prescribed medications for heart disease. Reviews the medication, class of the drug, and side effects. (all other drug classes)    Go Sex-Intimacy & Heart Disease, Get SMART - Goal Setting: - Group verbal and written instruction through game format to discuss  heart disease and the return to sexual intimacy. Provides group verbal and written material to discuss and apply goal setting through the application of the S.M.A.R.T. Method.   Cardiac Rehab from 12/22/2017 in [facility] Cardiac and Pulmonary Rehab  Date  12/15/17  Educator  CE  Instruction Review Code  1- Verbalizes Understanding      Other Matters of the Heart: - Provides group verbal, written materials and models to describe Stable Angina and Peripheral Artery. Includes description of the disease process and treatment options available to the cardiac patient.   Exercise & Equipment Safety: - Individual verbal instruction and demonstration of equipment use and safety with use of the equipment.   Cardiac Rehab from 12/22/2017 in [facility] Cardiac and Pulmonary Rehab  Date  12/08/17  Educator  [facility]  Instruction Review Code  1- Verbalizes Understanding      Infection Prevention: - Provides verbal and written material to individual with discussion of infection control including proper hand washing and proper equipment cleaning during exercise session.   Cardiac Rehab from 12/22/2017 in [facility] Cardiac and Pulmonary Rehab  Date  12/08/17  Educator  [facility]  Instruction Review Code  1- [employer]  Understanding      Falls Prevention: - Provides verbal and written material to individual with discussion of falls prevention and safety.   Cardiac Rehab from 12/22/2017 in [facility] Cardiac and Pulmonary Rehab  Date  12/08/17  Educator  [facility]  Instruction Review Code  1- Verbalizes Understanding      Diabetes: - Individual verbal and written instruction to review signs/symptoms of diabetes, desired ranges of glucose level fasting, after meals and with exercise. Acknowledge that pre and post exercise glucose checks will be done for 3 sessions at entry of program.   Cardiac Rehab from 12/22/2017 in [facility] Cardiac and Pulmonary Rehab  Date  12/08/17  Educator  [facility]  Instruction Review Code  1- Verbalizes Understanding       Know Your Numbers and Risk Factors: -Group verbal and written instruction about important numbers in your health.  Discussion of what are risk factors and how they play a role in the disease process.  Review of Cholesterol, Blood Pressure, Diabetes, and BMI and the role they play in your overall health.   Sleep Hygiene: -Provides group verbal and written instruction about how sleep can affect your health.  Define sleep hygiene, discuss sleep cycles and impact of sleep habits. Review good sleep hygiene tips.    Other: -Provides group and verbal instruction on various topics (see comments)   Knowledge Questionnaire Score: Knowledge Questionnaire Score - 12/15/17 1053      Knowledge Questionnaire Score   Pre Score  21/28       Core Components/Risk Factors/Patient Goals at Admission: Personal Goals and Risk Factors at Admission - 12/08/17 1501      Core Components/Risk Factors/Patient Goals on Admission    Weight Management  Yes;Weight Maintenance    Intervention  Weight Management: Develop a combined nutrition and exercise program designed to reach desired caloric intake, while maintaining appropriate intake of nutrient and fiber, sodium and fats, and appropriate energy expenditure required for the weight goal.;Weight Management: Provide education and appropriate resources to help participant work on and attain dietary goals.    Admit Weight  156 lb (70.8 kg)    Goal Weight: Long Term  156 lb (70.8 kg)    Expected Outcomes  Short Term: Continue to assess and modify interventions until short term weight is achieved;Long Term: Adherence to nutrition and physical activity/exercise program aimed toward attainment of established weight goal;Weight Maintenance: Understanding of the daily nutrition guidelines, which includes 25-35% calories from fat, 7% or less cal from saturated fats, less than '200mg'$  cholesterol, less than 1.5gm of sodium, & 5 or more servings of fruits and vegetables  daily;Understanding recommendations for meals to include 15-35% energy as protein, 25-35% energy from fat, 35-60% energy from carbohydrates, less than '200mg'$  of dietary cholesterol, 20-35 gm of total fiber daily;Understanding of distribution of calorie intake throughout the day with the consumption of 4-5 meals/snacks    Heart Failure  Yes    Intervention  Provide a combined exercise and nutrition program that is supplemented with education, support and counseling about heart failure. Directed toward relieving symptoms such as shortness of breath, decreased exercise tolerance, and extremity edema.    Expected Outcomes  Improve functional capacity of life;Short term: Attendance in program 2-3 days a week with increased exercise capacity. Reported lower sodium intake. Reported increased fruit and vegetable intake. Reports medication compliance.;Short term: Daily weights obtained and reported for increase. Utilizing diuretic protocols set by physician.;Long term: Adoption of self-care skills and reduction of barriers for early signs and  symptoms recognition and intervention leading to self-care maintenance.    Hypertension  Yes    Intervention  Provide education on lifestyle modifcations including regular physical activity/exercise, weight management, moderate sodium restriction and increased consumption of fresh fruit, vegetables, and low fat dairy, alcohol moderation, and smoking cessation.;Monitor prescription use compliance.    Expected Outcomes  Short Term: Continued assessment and intervention until BP is < 140/17m HG in hypertensive participants. < 130/8104mHG in hypertensive participants with diabetes, heart failure or chronic kidney disease.;Long Term: Maintenance of blood pressure at goal levels.    Lipids  Yes    Intervention  Provide education and support for participant on nutrition & aerobic/resistive exercise along with prescribed medications to achieve LDL '70mg'$ , HDL >'40mg'$ .    Expected Outcomes   Short Term: Participant states understanding of desired cholesterol values and is compliant with medications prescribed. Participant is following exercise prescription and nutrition guidelines.;Long Term: Cholesterol controlled with medications as prescribed, with individualized exercise RX and with personalized nutrition plan. Value goals: LDL < '70mg'$ , HDL > 40 mg.       Core Components/Risk Factors/Patient Goals Review:  Goals and Risk Factor Review    Row Name 12/22/17 1039             Core Components/Risk Factors/Patient Goals Review   Personal Goals Review  Weight Management/Obesity;Heart Failure;Hypertension;Lipids       Review  ClMaximillianas been doing well in rehab.  His blood pressures have been good.  He (or his wife) checks them frequently at home. His weight has been steady and he checks it daily at home.  He has not had any heart failure symptoms.  He is doing well with his medications and had a medication reconcilliation over the phone yesterday.       Expected Outcomes  Short: Continue to monitor heart failure symptoms daily.   Long: Continue to work on risk factors          Core Components/Risk Factors/Patient Goals at Discharge (Final Review):  Goals and Risk Factor Review - 12/22/17 1039      Core Components/Risk Factors/Patient Goals Review   Personal Goals Review  Weight Management/Obesity;Heart Failure;Hypertension;Lipids    Review  ClRamsesas been doing well in rehab.  His blood pressures have been good.  He (or his wife) checks them frequently at home. His weight has been steady and he checks it daily at home.  He has not had any heart failure symptoms.  He is doing well with his medications and had a medication reconcilliation over the phone yesterday.    Expected Outcomes  Short: Continue to monitor heart failure symptoms daily.   Long: Continue to work on risk factors       ITP Comments: ITP Comments    Row Name 12/08/17 1437 01/03/18 1024 01/04/18 0637        ITP Comments  Med Review completed. Initial ITP created. Diagnosis can be found in Media Tab  Pt's wife called and left a message that ClXadrianas still not feeling well and would miss class again today.   30 day review completed. Continue with ITP unless diercted changes per Medical Director.   3 visit this month        Comments:

## 2018-01-05 ENCOUNTER — Emergency Department
Admission: EM | Admit: 2018-01-05 | Discharge: 2018-01-05 | Disposition: A | Discharge status: Home / Self Care | Payer: Non-veteran care | Attending: Emergency Medicine | Admitting: Emergency Medicine

## 2018-01-05 ENCOUNTER — Other Ambulatory Visit: Payer: Self-pay

## 2018-01-05 ENCOUNTER — Emergency Department: Payer: Non-veteran care

## 2018-01-05 ENCOUNTER — Encounter: Payer: Non-veteran care | Admitting: *Deleted

## 2018-01-05 ENCOUNTER — Encounter: Payer: Self-pay | Admitting: Emergency Medicine

## 2018-01-05 VITALS — BP 122/60 | HR 83 | Resp 20 | Wt 156.0 lb

## 2018-01-05 DIAGNOSIS — R0600 Dyspnea, unspecified: Secondary | ICD-10-CM | POA: Insufficient documentation

## 2018-01-05 DIAGNOSIS — Z955 Presence of coronary angioplasty implant and graft: Secondary | ICD-10-CM

## 2018-01-05 DIAGNOSIS — I25810 Atherosclerosis of coronary artery bypass graft(s) without angina pectoris: Secondary | ICD-10-CM | POA: Insufficient documentation

## 2018-01-05 DIAGNOSIS — Z87891 Personal history of nicotine dependence: Secondary | ICD-10-CM | POA: Insufficient documentation

## 2018-01-05 DIAGNOSIS — E119 Type 2 diabetes mellitus without complications: Secondary | ICD-10-CM | POA: Diagnosis not present

## 2018-01-05 DIAGNOSIS — I493 Ventricular premature depolarization: Secondary | ICD-10-CM | POA: Diagnosis not present

## 2018-01-05 DIAGNOSIS — I2510 Atherosclerotic heart disease of native coronary artery without angina pectoris: Secondary | ICD-10-CM | POA: Diagnosis not present

## 2018-01-05 DIAGNOSIS — I10 Essential (primary) hypertension: Secondary | ICD-10-CM | POA: Insufficient documentation

## 2018-01-05 DIAGNOSIS — R0602 Shortness of breath: Secondary | ICD-10-CM | POA: Diagnosis present

## 2018-01-05 HISTORY — DX: Type 2 diabetes mellitus without complications: E11.9

## 2018-01-05 HISTORY — DX: Atherosclerotic heart disease of native coronary artery without angina pectoris: I25.10

## 2018-01-05 HISTORY — DX: Unspecified atrial fibrillation: I48.91

## 2018-01-05 HISTORY — DX: Disorder of thyroid, unspecified: E07.9

## 2018-01-05 HISTORY — DX: Chronic lymphocytic leukemia of B-cell type not having achieved remission: C91.10

## 2018-01-05 HISTORY — DX: Essential (primary) hypertension: I10

## 2018-01-05 LAB — BASIC METABOLIC PANEL
ANION GAP: 9 (ref 5–15)
BUN: 18 mg/dL (ref 6–20)
CHLORIDE: 106 mmol/L (ref 101–111)
CO2: 26 mmol/L (ref 22–32)
Calcium: 8.4 mg/dL — ABNORMAL LOW (ref 8.9–10.3)
Creatinine, Ser: 1.2 mg/dL (ref 0.61–1.24)
GFR calc Af Amer: >60 mL/min (ref >60)
GFR, EST NON AFRICAN AMERICAN: 53 mL/min — AB (ref >60)
GLUCOSE: 96 mg/dL (ref 65–99)
POTASSIUM: 4 mmol/L (ref 3.5–5.1)
Sodium: 141 mmol/L (ref 135–145)

## 2018-01-05 LAB — CBC
HEMATOCRIT: 37.3 % — AB (ref 40.0–52.0)
HEMOGLOBIN: 12.4 g/dL — AB (ref 13.0–18.0)
MCH: 31.6 pg (ref 26.0–34.0)
MCHC: 33.2 g/dL (ref 32.0–36.0)
MCV: 95.3 fL (ref 80.0–100.0)
Platelets: 172 10*3/uL (ref 150–440)
RBC: 3.92 MIL/uL — ABNORMAL LOW (ref 4.40–5.90)
RDW: 14.1 % (ref 11.5–14.5)
WBC: 11.3 10*3/uL — ABNORMAL HIGH (ref 3.8–10.6)

## 2018-01-05 LAB — TROPONIN I: Troponin I: <0.03 ng/mL (ref <0.03)

## 2018-01-05 LAB — MAGNESIUM: MAGNESIUM: 1.9 mg/dL (ref 1.7–2.4)

## 2018-01-05 MED ORDER — IOPAMIDOL (ISOVUE-370) INJECTION 76%
100.0000 mL | Freq: Once | INTRAVENOUS | Status: AC | PRN
  Administered 2018-01-05: 100 mL via INTRAVENOUS

## 2018-01-05 NOTE — ED Triage Notes (Addendum)
Pt reports several months of shortness of breath, pt was in cardiac rehab today downstairs and had a run of v-tach.  Pt is alert and oriented.  Pt reports shortness of breath at rest.  Pt states he can only walk a short distance until he runs out of breath. Pt able to speak in complete sentences without running out of breath.  Denies chest pain

## 2018-01-05 NOTE — ED Provider Notes (Signed)
[facility] Emergency Department Provider Note       Time seen: ----------------------------------------- 11:22 AM on 01/05/2018 -----------------------------------------   I have reviewed the triage vital signs and the nursing notes.  HISTORY   Chief Complaint Shortness of Breath    HPI Jared Castaneda is a 84 y.o. male with a history of A. fib, CLL, coronary artery disease, diabetes, hypertension and thyroid disease who presents to the ED for several months of shortness of breath.  Patient was in cardiac rehab today downstairs and had a run of V. tach.  He reports shortness of breath at rest and shortness of breath worsens with walking even short distances.  Past Medical History:  Diagnosis Date  . A-fib ([place])   . CLL (chronic lymphocytic leukemia) ([place])   . Coronary artery disease   . Diabetes mellitus without complication ([place])   . Hypertension   . Thyroid disease     Patient Active Problem List   Diagnosis Date Noted  . Hypertension 12/08/2017  . Dyslipidemia 12/08/2017  . Atrial fibrillation ([place]) 12/08/2017  . Status post coronary artery stent placement 12/08/2017  . CLL (chronic lymphocytic leukemia) ([place]) 12/08/2017  . History of cholecystectomy 12/08/2017  . Hx of CABG 12/08/2017  . Ischemic cardiomyopathy 12/08/2017    Past Surgical History:  Procedure Laterality Date  . CHOLECYSTECTOMY      Allergies Lisinopril  Social History Social History   Tobacco Use  . Smoking status: Former Smoker    Last attempt to quit: 1984    Years since quitting: 35.1  . Smokeless tobacco: Never Used  Substance Use Topics  . Alcohol use: No    Frequency: Never  . Drug use: Not on file    Review of Systems Constitutional: Negative for fever. Cardiovascular: Negative for chest pain. Respiratory: Positive for shortness of breath Gastrointestinal: Negative for abdominal pain, vomiting and diarrhea. Genitourinary: Negative for  dysuria. Musculoskeletal: Negative for back pain. Skin: Negative for rash. Neurological: Negative for headaches, focal weakness or numbness.  All systems negative/normal/unremarkable except as stated in the HPI  ____________________________________________   PHYSICAL EXAM:  VITAL SIGNS: ED Triage Vitals [01/05/18 1042]  Enc Vitals Group     BP (!) 117/55     Pulse Rate 62     Resp 18     Temp 97.8 F (36.6 C)     Temp Source Oral     SpO2 97 %     Weight 156 lb (70.8 kg)     Height 5\' 11"  (1.803 m)     Head Circumference      Peak Flow      Pain Score 0     Pain Loc      Pain Edu?      Excl. in [place]?     Constitutional: Alert and oriented. Well appearing and in no distress. Eyes: Conjunctivae are normal. Normal extraocular movements. ENT   Head: Normocephalic and atraumatic.   Nose: No congestion/rhinnorhea.   Mouth/Throat: Mucous membranes are moist.   Neck: No stridor. Cardiovascular: Normal rate, irregular rhythm.  No murmurs, rubs, or gallops. Respiratory: Normal respiratory effort without tachypnea nor retractions. Breath sounds are clear and equal bilaterally. No wheezes/rales/rhonchi. Gastrointestinal: Soft and nontender. Normal bowel sounds Musculoskeletal: Nontender with normal range of motion in extremities. No lower extremity tenderness nor edema. Neurologic:  Normal speech and language. No gross focal neurologic deficits are appreciated.  Skin:  Skin is warm, dry and intact. No rash noted. Psychiatric: Mood and  affect are normal. Speech and behavior are normal.  ____________________________________________  EKG: Interpreted by me.  Sinus rhythm with PACs, rate is 60 bpm, incomplete left bundle branch block, LVH with repolarization abnormality, borderline long QT  ____________________________________________  ED COURSE:  As part of my medical decision making, I reviewed the following data within the [place] History  obtained from family if available, nursing notes, old chart and ekg, as well as notes from prior ED visits. Patient presented for dyspnea and arrhythmia, we will assess with labs and imaging as indicated at this time.   Procedures ____________________________________________   LABS (pertinent positives/negatives)  Labs Reviewed  BASIC METABOLIC PANEL - Abnormal; Notable for the following components:      Result Value   Calcium 8.4 (*)    GFR calc non Af Amer 53 (*)    All other components within normal limits  CBC - Abnormal; Notable for the following components:   WBC 11.3 (*)    RBC 3.92 (*)    Hemoglobin 12.4 (*)    HCT 37.3 (*)    All other components within normal limits  TROPONIN I  MAGNESIUM    RADIOLOGY Images were viewed by me  Chest x-ray/CTA  IMPRESSION: 1. No evidence of thoracic or abdominal aortic aneurysm or dissection. Ectasia of the thoracic aorta with the ascending thoracic aorta measuring up to 3.7 cm in greatest diameter. 2. No evidence of central or large pulmonary emboli. 3. LEFT LOWER lobe subsegmental atelectasis versus scar 4. Cardiomegaly with CABG changes. 5. Mild prostate enlargement 6. Moderate severe degenerative changes in the LOWER lumbar spine 7. Aortic Atherosclerosis (ICD10-I70.0). ____________________________________________  DIFFERENTIAL DIAGNOSIS   Arrhythmia, electrolyte abnormality, coronary artery disease, MI, PE  FINAL ASSESSMENT AND PLAN  Dyspnea, arrhythmia   Plan: Patient had presented for dyspnea and PVCs seen in cardiac rehab. Patient's labs were within normal limits. 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  He has had occasional PVC here but no coupled beats or suggestions of V. tach.  These findings are chronic from the family's description.  He is cleared for outpatient follow-up with his cardiologist.   Laurence Aly, MD   Note: This note was generated in part or  whole with voice recognition software. Voice recognition is usually quite accurate but there are transcription errors that can and very often do occur. I apologize for any typographical errors that were not detected and corrected.     Earleen Newport, MD 01/05/18 1323

## 2018-01-05 NOTE — Progress Notes (Signed)
Daily Session Note  Patient Details  Name: Jared Castaneda MRN: [id number] Date of Birth: February 26, 1929 Referring Provider:     Cardiac Rehab from 12/08/2017 in [facility] Cardiac and Pulmonary Rehab  Referring Provider  [place]      Encounter Date: 01/05/2018  Check In: Session Check In - 01/05/18 0824      Check-In   Location  [facility]-Cardiac & Pulmonary Rehab    Staff Present  Alberteen Sam, MA, RCEP, CCRP, Exercise Physiologist;Amanda Oletta Darter, BA, ACSM CEP, Exercise Physiologist;Carroll Enterkin, RN, BSN    Supervising physician immediately available to respond to emergencies  See telemetry face sheet for immediately available ER MD    Medication changes reported      No    Fall or balance concerns reported     No    Warm-up and Cool-down  Performed on first and last piece of equipment    Resistance Training Performed  Yes    VAD Patient?  No      Pain Assessment   Currently in Pain?  No/denies          Social History   Tobacco Use  Smoking Status Former Smoker  . Last attempt to quit: 1984  . Years since quitting: 35.1  Smokeless Tobacco Never Used    Goals Met:  Independence with exercise equipment Exercise tolerated well No report of cardiac concerns or symptoms Strength training completed today  Goals Unmet:  Not Applicable  Comments: Pt able to follow exercise prescription today without complaint.  Will continue to monitor for progression.    Dr. Emily Filbert is Medical Director for [place] and [facility].

## 2018-01-05 NOTE — ED Notes (Signed)
Pt walked in hall on monitor. SR with rare PVC noted. Pt tolerated short walk with no sob. Dr Jimmye Norman notified.

## 2018-01-06 ENCOUNTER — Telehealth: Payer: Self-pay | Admitting: *Deleted

## 2018-01-06 ENCOUNTER — Encounter: Payer: Self-pay | Admitting: *Deleted

## 2018-01-06 DIAGNOSIS — Z955 Presence of coronary angioplasty implant and graft: Secondary | ICD-10-CM

## 2018-01-06 NOTE — Telephone Encounter (Signed)
Dr Lawana Pai from [facility] cardiology called to let us know that Jared Castaneda is cleared to resume rehab on Tuesday as he checked out fine in the clinic.  He was doing better today.  Dr. Lawana Pai said to call him with any further needs and he can be reached at [phone] ext [phone]

## 2018-01-10 ENCOUNTER — Encounter: Payer: Non-veteran care | Attending: Cardiology

## 2018-01-10 DIAGNOSIS — I2510 Atherosclerotic heart disease of native coronary artery without angina pectoris: Secondary | ICD-10-CM | POA: Insufficient documentation

## 2018-01-12 ENCOUNTER — Telehealth: Payer: Self-pay | Admitting: *Deleted

## 2018-01-12 NOTE — Telephone Encounter (Signed)
Jared Castaneda called to let us that Jared Castaneda would be out today as it was too cold for them to go out.

## 2018-01-17 DIAGNOSIS — I2510 Atherosclerotic heart disease of native coronary artery without angina pectoris: Secondary | ICD-10-CM | POA: Diagnosis present

## 2018-01-17 DIAGNOSIS — Z955 Presence of coronary angioplasty implant and graft: Secondary | ICD-10-CM

## 2018-01-17 NOTE — Progress Notes (Signed)
Daily Session Note  Patient Details  Name: Jared Castaneda MRN: [id number] Date of Birth: [birth year] Referring Provider:     Cardiac Rehab from 12/08/2017 in [facility] Cardiac and Pulmonary Rehab  Referring Provider  [place]      Encounter Date: 01/17/2018  Check In: Session Check In - 01/17/18 0833      Check-In   Location  [facility]-Cardiac & Pulmonary Rehab    Staff Present  Alberteen Sam, MA, RCEP, CCRP, Exercise Physiologist;Lynett Brasil Oletta Darter, BA, ACSM CEP, Exercise Physiologist;Susanne Bice, RN, BSN, CCRP    Supervising physician immediately available to respond to emergencies  See telemetry face sheet for immediately available ER MD    Medication changes reported      No    Fall or balance concerns reported     No    Warm-up and Cool-down  Performed on first and last piece of equipment    Resistance Training Performed  Yes    VAD Patient?  No      Pain Assessment   Currently in Pain?  No/denies    Multiple Pain Sites  No          Social History   Tobacco Use  Smoking Status Former Smoker  . Last attempt to quit: 1984  . Years since quitting: 35.2  Smokeless Tobacco Never Used    Goals Met:  Independence with exercise equipment Exercise tolerated well Strength training completed today  Goals Unmet:  Not Applicable  Comments: Pt able to follow exercise prescription today without complaint.  Will continue to monitor for progression.    Dr. Emily Filbert is Medical Director for [place] and [facility].

## 2018-01-19 DIAGNOSIS — Z955 Presence of coronary angioplasty implant and graft: Secondary | ICD-10-CM

## 2018-01-19 DIAGNOSIS — I2510 Atherosclerotic heart disease of native coronary artery without angina pectoris: Secondary | ICD-10-CM | POA: Diagnosis not present

## 2018-01-19 NOTE — Progress Notes (Signed)
Daily Session Note  Patient Details  Name: Jared Castaneda MRN: [id number] Date of Birth: 10/12/1929 Referring Provider:     Cardiac Rehab from 12/08/2017 in [facility]iac [facility] Rehab  Referring Provider  [place]      Encounter Date: 01/19/2018  Check In: Session Check In - 01/19/18 0903      Check-In   Location  [facility]-Cardiac & Pulmonary Rehab    Staff Present  Diane Joya Gaskins RN,BSN;Tymeka Privette Oletta Darter, BA, ACSM CEP, Exercise Physiologist;Carroll Enterkin, RN, BSN    Supervising physician immediately available to respond to emergencies  See telemetry face sheet for immediately available ER MD    Medication changes reported      No    Fall or balance concerns reported     No    Warm-up and Cool-down  Performed on first and last piece of equipment    Resistance Training Performed  Yes    VAD Patient?  No      Pain Assessment   Currently in Pain?  No/denies    Multiple Pain Sites  No          Social History   Tobacco Use  Smoking Status Former Smoker  . Last attempt to quit: 1984  . Years since quitting: 35.2  Smokeless Tobacco Never Used    Goals Met:  Independence with exercise equipment Exercise tolerated well No report of cardiac concerns or symptoms Strength training completed today  Goals Unmet:  Not Applicable  Comments: Pt able to follow exercise prescription today without complaint.  Will continue to monitor for progression.    Dr. Emily Filbert is Medical Director for [place] and [facility].

## 2018-01-24 ENCOUNTER — Encounter: Payer: Non-veteran care | Admitting: *Deleted

## 2018-01-24 DIAGNOSIS — Z955 Presence of coronary angioplasty implant and graft: Secondary | ICD-10-CM

## 2018-01-24 DIAGNOSIS — I2510 Atherosclerotic heart disease of native coronary artery without angina pectoris: Secondary | ICD-10-CM | POA: Diagnosis not present

## 2018-01-24 NOTE — Progress Notes (Signed)
Daily Session Note  Patient Details  Name: Jared Castaneda MRN: [id number] Date of Birth: May 18, 1929 Referring Provider:     Cardiac Rehab from 12/08/2017 in [facility] Cardiac and Pulmonary Rehab  Referring Provider  [place]      Encounter Date: 01/24/2018  Check In: Session Check In - 01/24/18 0853      Check-In   Location  [facility]-Cardiac & Pulmonary Rehab    Staff Present  Alberteen Sam, MA, RCEP, CCRP, Exercise Physiologist;Amanda Oletta Darter, BA, ACSM CEP, Exercise Physiologist;Susanne Bice, RN, BSN, CCRP    Supervising physician immediately available to respond to emergencies  See telemetry face sheet for immediately available ER MD    Medication changes reported      No    Fall or balance concerns reported     No    Warm-up and Cool-down  Performed on first and last piece of equipment    Resistance Training Performed  Yes    VAD Patient?  No      Pain Assessment   Currently in Pain?  No/denies          Social History   Tobacco Use  Smoking Status Former Smoker  . Last attempt to quit: 1984  . Years since quitting: 35.2  Smokeless Tobacco Never Used    Goals Met:  Independence with exercise equipment Exercise tolerated well Personal goals reviewed No report of cardiac concerns or symptoms Strength training completed today  Goals Unmet:  Not Applicable  Comments:Pt able to follow exercise prescription today without complaint.  Will continue to monitor for progression. See ITP for goal review    Dr. Emily Filbert is Medical Director for [place] and [facility].

## 2018-01-26 DIAGNOSIS — Z955 Presence of coronary angioplasty implant and graft: Secondary | ICD-10-CM

## 2018-01-26 DIAGNOSIS — I2510 Atherosclerotic heart disease of native coronary artery without angina pectoris: Secondary | ICD-10-CM | POA: Diagnosis not present

## 2018-01-26 NOTE — Progress Notes (Signed)
Daily Session Note  Patient Details  Name: Jared Castaneda MRN: [id number] Date of Birth: 1929-10-16 Referring Provider:     Cardiac Rehab from 12/08/2017 in [facility] Cardiac and Pulmonary Rehab  Referring Provider  [place]      Encounter Date: 01/26/2018  Check In: Session Check In - 01/26/18 0830      Check-In   Location  [facility]-Cardiac & Pulmonary Rehab    Staff Present  Alberteen Sam, MA, RCEP, CCRP, Exercise Physiologist;Jamaine Quintin Oletta Darter, BA, ACSM CEP, Exercise Physiologist;Carroll Enterkin, RN, BSN    Supervising physician immediately available to respond to emergencies  See telemetry face sheet for immediately available ER MD    Medication changes reported      No    Fall or balance concerns reported     No    Warm-up and Cool-down  Performed on first and last piece of equipment    Resistance Training Performed  Yes    VAD Patient?  No      Pain Assessment   Currently in Pain?  No/denies    Multiple Pain Sites  No          Social History   Tobacco Use  Smoking Status Former Smoker  . Last attempt to quit: 1984  . Years since quitting: 35.2  Smokeless Tobacco Never Used    Goals Met:  Independence with exercise equipment Exercise tolerated well No report of cardiac concerns or symptoms Strength training completed today  Goals Unmet:  Not Applicable  Comments: Pt able to follow exercise prescription today without complaint.  Will continue to monitor for progression.    Dr. Emily Filbert is Medical Director for [place] and [facility].

## 2018-02-01 ENCOUNTER — Encounter: Payer: Self-pay | Admitting: *Deleted

## 2018-02-01 DIAGNOSIS — Z955 Presence of coronary angioplasty implant and graft: Secondary | ICD-10-CM

## 2018-02-01 NOTE — Progress Notes (Signed)
Cardiac Individual Treatment Plan  Patient Details  Name: Jared Castaneda MRN: [id number] Date of Birth: February 14, 1929 Referring Provider:     Cardiac Rehab from 12/08/2017 in [facility] Cardiac and Pulmonary Rehab  Referring Provider  [place]      Initial Encounter Date:    Cardiac Rehab from 12/08/2017 in [facility] Cardiac and Pulmonary Rehab  Date  12/08/17  Referring Provider  Lawana Pai      Visit Diagnosis: Status post coronary artery stent placement  Patient's Home Medications on Admission:  Current Outpatient Medications:  .  acetaminophen (TYLENOL) 325 MG tablet, Take 650 mg by mouth every 6 (six) hours as needed., Disp: , Rfl:  .  apixaban (ELIQUIS) 5 MG TABS tablet, Take 5 mg by mouth 2 (two) times daily., Disp: , Rfl:  .  atorvastatin (LIPITOR) 80 MG tablet, Take 80 mg by mouth at bedtime., Disp: , Rfl:  .  carvedilol (COREG) 6.25 MG tablet, Take 6.25 mg by mouth 2 (two) times daily with a meal., Disp: , Rfl:  .  clopidogrel (PLAVIX) 75 MG tablet, Take 75 mg by mouth daily., Disp: , Rfl:  .  docusate sodium (COLACE) 100 MG capsule, Take 100 mg by mouth daily., Disp: , Rfl:  .  furosemide (LASIX) 20 MG tablet, Take 20 mg by mouth 2 (two) times daily as needed for fluid ("for Weigth Gain of more than 3 pounds or shortness of breath)., Disp: , Rfl:  .  magnesium oxide (MAG-OX) 400 MG tablet, Take 400 mg by mouth daily., Disp: , Rfl:  .  nitroGLYCERIN (NITROSTAT) 0.4 MG SL tablet, Place 0.4 mg under the tongue every 5 (five) minutes as needed for chest pain., Disp: , Rfl:   Past Medical History: Past Medical History:  Diagnosis Date  . A-fib ([place])   . CLL (chronic lymphocytic leukemia) ([place])   . Coronary artery disease   . Diabetes mellitus without complication ([place])   . Hypertension   . Thyroid disease     Tobacco Use: Social History   Tobacco Use  Smoking Status Former Smoker  . Last attempt to quit: 1984  . Years since quitting: 35.2  Smokeless Tobacco Never Used     Labs: Recent Review Flowsheet Data    There is no flowsheet data to display.       Exercise Target Goals:    Exercise Program Goal: Individual exercise prescription set using results from initial 6 min walk test and THRR while considering  patient's activity barriers and safety.   Exercise Prescription Goal: Initial exercise prescription builds to 30-45 minutes a day of aerobic activity, 2-3 days per week.  Home exercise guidelines will be given to patient during program as part of exercise prescription that the participant will acknowledge.  Activity Barriers & Risk Stratification: Activity Barriers & Cardiac Risk Stratification - 12/08/17 1514      Activity Barriers & Cardiac Risk Stratification   Activity Barriers  Arthritis;Shortness of Breath    Cardiac Risk Stratification  High       6 Minute Walk: 6 Minute Walk    Row Name 12/08/17 1308         6 Minute Walk   Phase  Initial     Distance  742 feet     Walk Time  6 minutes     # of Rest Breaks  0     MPH  1.4     METS  1.36     RPE  13     Perceived  Dyspnea   1     VO2 Peak  4.76     Symptoms  Yes (comment)     Comments  2/10 hip arthritis  used his cane     Resting HR  59 bpm     Resting BP  112/64     Resting Oxygen Saturation   98 % Room air     Exercise Oxygen Saturation  during 6 min walk  92 % Room air     Max Ex. BP  136/56        Oxygen Initial Assessment:   Oxygen Re-Evaluation:   Oxygen Discharge (Final Oxygen Re-Evaluation):   Initial Exercise Prescription: Initial Exercise Prescription - 12/08/17 1400      Date of Initial Exercise RX and Referring Provider   Date  12/08/17    Referring Provider  [place]      Treadmill   MPH  1    Grade  0    Minutes  15      NuStep   Level  1    SPM  80    Minutes  15    METs  1.3      Biostep-RELP   Level  1    SPM  50    Minutes  15    METs  1.3      Track   Laps  11    Minutes  15    METs  1.5      Prescription Details    Frequency (times per week)  3    Duration  Progress to 30 minutes of continuous aerobic without signs/symptoms of physical distress      Intensity   THRR 40-80% of Max Heartrate  89-119    Ratings of Perceived Exertion  11-13    Perceived Dyspnea  0-4      Resistance Training   Training Prescription  Yes    Weight  2 lb    Reps  10-15       Perform Capillary Blood Glucose checks as needed.  Exercise Prescription Changes: Exercise Prescription Changes    Row Name 12/08/17 1500 12/22/17 1000 12/27/17 1600 01/11/18 0800 01/24/18 1500     Response to Exercise   Blood Pressure (Admit)  112/64  -  126/64  136/60  136/72   Blood Pressure (Exercise)  136/56  -  138/68  122/60  134/60   Blood Pressure (Exit)  -  -  122/54  128/60  114/60   Heart Rate (Admit)  71 bpm  -  86 bpm  68 bpm  71 bpm   Heart Rate (Exercise)  93 bpm  -  95 bpm  102 bpm  98 bpm   Heart Rate (Exit)  69 bpm  -  68 bpm  72 bpm  65 bpm   Oxygen Saturation (Admit)  98 %  -  -  -  -   Oxygen Saturation (Exercise)  92 %  -  -  -  -   Rating of Perceived Exertion (Exercise)  13  -  _0 Symptoms  -  -  none  none  none   Comments  -  -  third full day of exercise  -  -   Duration  -  -  Continue with 30 min of aerobic exercise without signs/symptoms of physical distress.  Continue with 30 min of aerobic exercise without signs/symptoms of physical distress.  Continue with 30  min of aerobic exercise without signs/symptoms of physical distress.   Intensity  -  -  THRR unchanged  THRR unchanged  THRR unchanged     Progression   Progression  -  -  Continue to progress workloads to maintain intensity without signs/symptoms of physical distress.  Continue to progress workloads to maintain intensity without signs/symptoms of physical distress.  Continue to progress workloads to maintain intensity without signs/symptoms of physical distress.   Average METs  -  -  2.17  2.15  2.25     Resistance Training   Training  Prescription  -  -  Yes  Yes  Yes   Weight  -  -  2 lb  2 lbs  2 lbs   Reps  -  -  10-15  10-15  10-15     Interval Training   Interval Training  -  -  No  No  No     NuStep   Level  -  -  _0 Minutes  -  -  _1 METs  -  -  2.4  2.3  2.5     Biostep-RELP   Level  -  -  _2 Minutes  -  -  _3 METs  -  -  _4 Track   Laps  -  -  24  -  -   Minutes  -  -  15  -  -   METs  -  -  2.11  -  -     Home Exercise Plan   Plans to continue exercise at  -  Home (comment) walking  Home (comment) walking  Home (comment) walking  Home (comment) walking   Frequency  -  Add 3 additional days to program exercise sessions.  Add 3 additional days to program exercise sessions.  Add 3 additional days to program exercise sessions.  Add 3 additional days to program exercise sessions.   Initial Home Exercises Provided  -  12/22/17  12/22/17  12/22/17  12/22/17      Exercise Comments: Exercise Comments    Row Name 12/15/17 1053 12/22/17 0945         Exercise Comments  First full day of exercise!  Patient was oriented to gym and equipment including functions, settings, policies, and procedures.  Patient's individual exercise prescription and treatment plan were reviewed.  All starting workloads were established based on the results of the 6 minute walk test done at initial orientation visit.  The plan for exercise progression was also introduced and progression will be customized based on patient's performance and goals.  Reviewed home exercise with pt today.  Pt plans to walk and do stretches for exercise.  Reviewed THR, pulse, RPE, sign and symptoms, NTG use, and when to call 911 or MD.  Also discussed weather considerations and indoor options.  Pt voiced understanding.         Exercise Goals and Review: Exercise Goals    Row Name 12/08/17 1436             Exercise Goals   Increase Physical Activity  Yes       Intervention  Provide advice, education,  support and counseling about physical activity/exercise needs.;Develop an individualized exercise prescription for aerobic and resistive training based on initial evaluation  findings, risk stratification, comorbidities and participant's personal goals.       Expected Outcomes  Short Term: Attend rehab on a regular basis to increase amount of physical activity.;Long Term: Add in home exercise to make exercise part of routine and to increase amount of physical activity.;Long Term: Exercising regularly at least 3-5 days a week.       Increase Strength and Stamina  Yes       Intervention  Provide advice, education, support and counseling about physical activity/exercise needs.;Develop an individualized exercise prescription for aerobic and resistive training based on initial evaluation findings, risk stratification, comorbidities and participant's personal goals.       Expected Outcomes  Short Term: Increase workloads from initial exercise prescription for resistance, speed, and METs.;Short Term: Perform resistance training exercises routinely during rehab and add in resistance training at home;Long Term: Improve cardiorespiratory fitness, muscular endurance and strength as measured by increased METs and functional capacity (6MWT)       Able to understand and use rate of perceived exertion (RPE) scale  Yes       Intervention  Provide education and explanation on how to use RPE scale       Expected Outcomes  Short Term: Able to use RPE daily in rehab to express subjective intensity level;Long Term:  Able to use RPE to guide intensity level when exercising independently       Able to understand and use Dyspnea scale  Yes       Intervention  Provide education and explanation on how to use Dyspnea scale       Expected Outcomes  Short Term: Able to use Dyspnea scale daily in rehab to express subjective sense of shortness of breath during exertion;Long Term: Able to use Dyspnea scale to guide intensity level when  exercising independently       Knowledge and understanding of Target Heart Rate Range (THRR)  Yes       Intervention  Provide education and explanation of THRR including how the numbers were predicted and where they are located for reference       Expected Outcomes  Long Term: Able to use THRR to govern intensity when exercising independently;Short Term: Able to state/look up THRR;Short Term: Able to use daily as guideline for intensity in rehab       Able to check pulse independently  Yes       Intervention  Provide education and demonstration on how to check pulse in carotid and radial arteries.;Review the importance of being able to check your own pulse for safety during independent exercise       Expected Outcomes  Short Term: Able to explain why pulse checking is important during independent exercise;Long Term: Able to check pulse independently and accurately       Understanding of Exercise Prescription  Yes       Intervention  Provide education, explanation, and written materials on patient's individual exercise prescription       Expected Outcomes  Short Term: Able to explain program exercise prescription;Long Term: Able to explain home exercise prescription to exercise independently          Exercise Goals Re-Evaluation : Exercise Goals Re-Evaluation    Row Name 12/15/17 1053 12/22/17 0945 01/11/18 0835 01/24/18 0854       Exercise Goal Re-Evaluation   Exercise Goals Review  Increase Physical Activity;Increase Strength and Stamina;Understanding of Exercise Prescription;Knowledge and understanding of Target Heart Rate Range (THRR);Able to understand and use rate of perceived  exertion (RPE) scale  Increase Physical Activity;Increase Strength and Stamina;Able to understand and use rate of perceived exertion (RPE) scale;Knowledge and understanding of Target Heart Rate Range (THRR);Able to check pulse independently;Understanding of Exercise Prescription  Increase Physical Activity;Understanding  of Exercise Prescription;Increase Strength and Stamina  Increase Physical Activity;Understanding of Exercise Prescription;Increase Strength and Stamina    Comments  Reviewed RPE scale, THR and program prescription with pt today.  Pt voiced understanding and was given a copy of goals to take home  Jared Castaneda has been doing well in rehab.  He is able to do more at home as he recovers his strength and stamina.  Reviewed home exercise with pt today.  Pt plans to walk and do stretches for exercise.  Reviewed THR, pulse, RPE, sign and symptoms, NTG use, and when to call 911 or MD.  Also discussed weather considerations and indoor options.  Pt voiced understanding.  Jared Castaneda has been doing well in rehab.  He did have a 5 beat run of vtach walking last week, but his doctor would like to see him in class and exercising regularly.  He is up to level 3 on the BioStep.  We will continue to monitor his progression and his rhythm  Jared Castaneda has been doing well in rehab.  He is getting more active and walking every day.  He and his wife have been walking out in the yard and going to the track for about an hour each day.  He still takes some rest breaks, but is going further than ever before.  He feels better overall.     Expected Outcomes  Short: Use RPE daily to regulate intensity.  Long: Follow program prescription in THR.  Short - Pt will continue exercise at home most days and monitor pulse and RPE Long - Pt will exercise on his own  Short: Continue to encourage him to exercise at home.  Long: Continue to increase activity levels.   Short: Continue to exercise daily at home.  Long: Continue to try to increase workloads.        Discharge Exercise Prescription (Final Exercise Prescription Changes): Exercise Prescription Changes - 01/24/18 1500      Response to Exercise   Blood Pressure (Admit)  136/72    Blood Pressure (Exercise)  134/60    Blood Pressure (Exit)  114/60    Heart Rate (Admit)  71 bpm    Heart Rate  (Exercise)  98 bpm    Heart Rate (Exit)  65 bpm    Rating of Perceived Exertion (Exercise)  10    Symptoms  none    Duration  Continue with 30 min of aerobic exercise without signs/symptoms of physical distress.    Intensity  THRR unchanged      Progression   Progression  Continue to progress workloads to maintain intensity without signs/symptoms of physical distress.    Average METs  2.25      Resistance Training   Training Prescription  Yes    Weight  2 lbs    Reps  10-15      Interval Training   Interval Training  No      NuStep   Level  3    Minutes  15    METs  2.5      Biostep-RELP   Level  3    Minutes  15    METs  2      Home Exercise Plan   Plans to continue exercise at  Home (comment)  walking    Frequency  Add 3 additional days to program exercise sessions.    Initial Home Exercises Provided  12/22/17       Nutrition:  Target Goals: Understanding of nutrition guidelines, daily intake of sodium <153m, cholesterol <2031m calories 30% from fat and 7% or less from saturated fats, daily to have 5 or more servings of fruits and vegetables.  Biometrics: Pre Biometrics - 12/08/17 1435      Pre Biometrics   Height  5' 9.5" (1.765 m)    Weight  156 lb (70.8 kg)    Waist Circumference  36.5 inches    Hip Circumference  39.5 inches    Waist to Hip Ratio  0.92 %    BMI (Calculated)  22.71        Nutrition Therapy Plan and Nutrition Goals: Nutrition Therapy & Goals - 12/22/17 1038      Nutrition Therapy   Diet  DASH    Drug/Food Interactions  Statins/Certain Fruits    Protein (specify units)  8    Fiber  30 grams    Whole Grain Foods  2 servings    Saturated Fats  14 max. grams    Fruits and Vegetables  5 servings/day    Sodium  1500 grams      Personal Nutrition Goals   Nutrition Goal  No more than 60024mf sodium per meal / frozen meal    Personal Goal #2  Keep sodium below 200-300m21mr snacks    Personal Goal #3  Choose whole grain foods at  least half the time, brown rice for example    Personal Goal #4  Continue to practice reading nutrition facts labels consistently to identify foods high in sodium and fat      Intervention Plan   Intervention  Prescribe, educate and counsel regarding individualized specific dietary modifications aiming towards targeted core components such as weight, hypertension, lipid management, diabetes, heart failure and other comorbidities.    Expected Outcomes  Short Term Goal: Understand basic principles of dietary content, such as calories, fat, sodium, cholesterol and nutrients.;Short Term Goal: A plan has been developed with personal nutrition goals set during dietitian appointment.;Long Term Goal: Adherence to prescribed nutrition plan.       Nutrition Assessments: Nutrition Assessments - 12/22/17 1325      MEDFICTS Scores   Pre Score  29       Nutrition Goals Re-Evaluation: Nutrition Goals Re-Evaluation    Row Name 01/24/18 0902             Goals   Nutrition Goal  No more than 600mg92msodium per meal / frozen meal, 200-300 mg snacks, whole grains, reading labels       Comment  ClareKaelanhis wife have been reading food labels more than ever.  They are closely monitoring his salt intake and have really cut back across the boads.  They are also trying to get in more whole grains and cutting back on sweets.  They said they learned a lot at thier nutrition appointment.        Expected Outcome  Short: Continue to try to reduce sodium intake.  Long: Continue to work on recomPsychologist, clinicalheart healthy diet.           Nutrition Goals Discharge (Final Nutrition Goals Re-Evaluation): Nutrition Goals Re-Evaluation - 01/24/18 0902      Goals   Nutrition Goal  No more than 600mg 29modium per meal / frozen meal,  200-300 mg snacks, whole grains, reading labels    Comment  Jared Castaneda and his wife have been reading food labels more than ever.  They are closely monitoring his salt intake and have  really cut back across the boads.  They are also trying to get in more whole grains and cutting back on sweets.  They said they learned a lot at thier nutrition appointment.     Expected Outcome  Short: Continue to try to reduce sodium intake.  Long: Continue to work on Psychologist, clinical for heart healthy diet.        Psychosocial: Target Goals: Acknowledge presence or absence of significant depression and/or stress, maximize coping skills, provide positive support system. Participant is able to verbalize types and ability to use techniques and skills needed for reducing stress and depression.   Initial Review & Psychosocial Screening: Initial Psych Review & Screening - 12/08/17 1508      Initial Review   Current issues with  Current Sleep Concerns wakes up around 2-3 am most mornings and struggles going back to sleep. 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  Yes spouse, children, church family      Screening Interventions   Interventions  Encouraged to exercise;Program counselor consult;To provide support and resources with identified psychosocial needs;Provide feedback about the scores to participant    Expected Outcomes  Short Term goal: Utilizing psychosocial counselor, staff and physician to assist with identification of specific Stressors or current issues interfering with healing process. Setting desired goal for each stressor or current issue identified.;Long Term Goal: Stressors or current issues are controlled or eliminated.;Short Term goal: Identification and review with participant of any Quality of Life or Depression concerns found by scoring the questionnaire.;Long Term goal: The participant improves quality of Life and PHQ9 Scores as seen by post scores and/or verbalization of changes       Quality of Life Scores:  Quality of Life - 12/08/17 1509      Quality of Life Scores   Health/Function Pre  20.87 %     Socioeconomic Pre  21 %    Psych/Spiritual Pre  21 %    Family Pre  21 %    GLOBAL Pre  20.94 %      Scores of 19 and below usually indicate a poorer quality of life in these areas.  A difference of  2-3 points is a clinically meaningful difference.  A difference of 2-3 points in the total score of the Quality of Life Index has been associated with significant improvement in overall quality of life, self-image, physical symptoms, and general health in studies assessing change in quality of life.  PHQ-9: Recent Review Flowsheet Data    Depression screen [facility] 2/9 12/08/2017   Decreased Interest 0   Down, Depressed, Hopeless 0   PHQ - 2 Score 0   Altered sleeping 3   Tired, decreased energy 1   Change in appetite 0   Feeling bad or failure about yourself  0   Trouble concentrating 0   Moving slowly or fidgety/restless 0   Suicidal thoughts 0   PHQ-9 Score 4   Difficult doing work/chores Not difficult at all     Interpretation of Total Score  Total Score Depression Severity:  1-4 = Minimal depression, 5-9 = Mild depression, 10-14 = Moderate depression, 15-19 = Moderately severe depression, 20-27 = Severe depression   Psychosocial Evaluation and  Intervention: Psychosocial Evaluation - 01/05/18 1031      Psychosocial Evaluation & Interventions   Interventions  Encouraged to exercise with the program and follow exercise prescription    Comments  Counselor met with Mr. Rybacki Wuthrich) today for initial psychosocial evaluation.  He is an 84 year old who had open heart surgery in 1983 and recently had a heart attack and stent inserted.  He has a strong support system with a spouse of 34 years and (3) adult children who live close by.  He is also actively involved in his local church.  Jared Castaneda reports sleeping intermittently with ~6 hours overall sleep/night.  He has a good appetite.  Cort denies a history of depression or anxiety or any current symptoms and he is typically in a positive  mood most of the time.  Jared Castaneda reports other than his health, he has minimal stress in his life.  He has goals to increase his stamina and strength and hopefully be more energetic  from consistently exercising in this program.  Staff will follow.      Expected Outcomes  Jared Castaneda will benefit from consistent exercise to achieve his stated goals.  The educational and psychoeducational components of this program will be helpful as well in understanding and coping with his condition more positively.      Continue Psychosocial Services   Follow up required by staff       Psychosocial Re-Evaluation: Psychosocial Re-Evaluation    [place] Name 12/22/17 1052 01/06/18 0728 01/24/18 0904         Psychosocial Re-Evaluation   Current issues with  None Identified  Current Stress Concerns  Current Stress Concerns     Comments  Jared Castaneda is doing well in rehab.  He has a strong support system in his wife.  He tries not to let anything bother him too much.  He sleep between 6-8 hrs each night despite waking every couple of hours.  He has enjoyed coming out to exercise as his made him more active and makes him feel better.   Taken to [facility] shortness of breath worse this past week and run of vtach per heart monitor in [place]. Jared Castaneda's wife was with him when I took him to the [employer].   Jared Castaneda continues to remain positive!  His wife is still his biggest supporter.  Their health states are their biggest stressors. She has cancer and he has his heart failure which makes him short of breath.  He continues to go to church and continues to sleep well.      Expected Outcomes  Short: continue to attend rehab to build strength and stamina.  Long: Continue to stay positve.   -  Short: Continue to rely on each other for support.  Long: Continue to go to [employer] and stay positive     Interventions  Encouraged to attend Cardiac Rehabilitation for the exercise;Stress management education   -  -     Continue Psychosocial Services   Follow up required by staff  -  -       Initial Review   Source of Stress Concerns  -  -  Chronic Illness        Psychosocial Discharge (Final Psychosocial Re-Evaluation): Psychosocial Re-Evaluation - 01/24/18 0904      Psychosocial Re-Evaluation   Current issues with  Current Stress Concerns    Comments  Jared Castaneda continues to remain positive!  His wife is still  his biggest supporter.  Their health states are their biggest stressors. She has cancer and he has his heart failure which makes him short of breath.  He continues to go to church and continues to sleep well.     Expected Outcomes  Short: Continue to rely on each other for support.  Long: Continue to go to [employer] and stay positive      Initial Review   Source of Stress Concerns  Chronic Illness       Vocational Rehabilitation: Provide vocational rehab assistance to qualifying candidates.   Vocational Rehab Evaluation & Intervention: Vocational Rehab - 12/08/17 1513      Initial Vocational Rehab Evaluation & Intervention   Assessment shows need for Vocational Rehabilitation  No       Education: Education Goals: Education classes will be provided on a variety of topics geared toward better understanding of heart health and risk factor modification. Participant will state understanding/return demonstration of topics presented as noted by education test scores.  Learning Barriers/Preferences: Learning Barriers/Preferences - 12/08/17 1512      Learning Barriers/Preferences   Learning Barriers  Exercise Concerns    Learning Preferences  None       Education Topics:  AED/CPR: - Group verbal and written instruction with the use of models to demonstrate the basic use of the AED with the basic ABC's of resuscitation.   Cardiac Rehab from 01/26/2018 in [facility] Cardiac and Pulmonary Rehab  Date  12/20/17  Educator  SB  Instruction Review Code  1- Verbalizes Understanding       General Nutrition Guidelines/Fats and Fiber: -Group instruction provided by verbal, written material, models and posters to present the general guidelines for heart healthy nutrition. Gives an explanation and review of dietary fats and fiber.   Controlling Sodium/Reading Food Labels: -Group verbal and written material supporting the discussion of sodium use in heart healthy nutrition. Review and explanation with models, verbal and written materials for utilization of the food label.   Exercise Physiology & General Exercise Guidelines: - Group verbal and written instruction with models to review the exercise physiology of the cardiovascular system and associated critical values. Provides general exercise guidelines with specific guidelines to those with heart or lung disease.    Aerobic Exercise & Resistance Training: - Gives group verbal and written instruction on the various components of exercise. Focuses on aerobic and resistive training programs and the benefits of this training and how to safely progress through these programs..   Flexibility, Balance, Mind/Body Relaxation: Provides group verbal/written instruction on the benefits of flexibility and balance training, including mind/body exercise modes such as yoga, pilates and tai chi.  Demonstration and skill practice provided.   Cardiac Rehab from 01/26/2018 in [facility] Cardiac and Pulmonary Rehab  Date  01/05/18  Educator  AS  Instruction Review Code  1- Verbalizes Understanding      Stress and Anxiety: - Provides group verbal and written instruction about the health risks of elevated stress and causes of high stress.  Discuss the correlation between heart/lung disease and anxiety and treatment options. Review healthy ways to manage with stress and anxiety.   Depression: - Provides group verbal and written instruction on the correlation between heart/lung disease and depressed mood, treatment options, and the stigmas associated  with seeking treatment.   Anatomy & Physiology of the Heart: - Group verbal and written instruction and models provide basic cardiac anatomy and physiology, with the coronary electrical and arterial systems. Review of Valvular disease and Heart  Failure   Cardiac Rehab from 01/26/2018 in [facility] Cardiac and Pulmonary Rehab  Date  01/26/18  Educator  CE  Instruction Review Code  1- Verbalizes Understanding      Cardiac Procedures: - Group verbal and written instruction to review commonly prescribed medications for heart disease. Reviews the medication, class of the drug, and side effects. Includes the steps to properly store meds and maintain the prescription regimen. (beta blockers and nitrates)   Cardiac Rehab from 01/26/2018 in [facility] Cardiac and Pulmonary Rehab  Date  12/15/17  Educator  CE  Instruction Review Code  1- Verbalizes Understanding      Cardiac Medications I: - Group verbal and written instruction to review commonly prescribed medications for heart disease. Reviews the medication, class of the drug, and side effects. Includes the steps to properly store meds and maintain the prescription regimen.   Cardiac Rehab from 01/26/2018 in [facility] Cardiac and Pulmonary Rehab  Date  01/17/18  Educator  SB  Instruction Review Code  1- Verbalizes Understanding      Cardiac Medications II: -Group verbal and written instruction to review commonly prescribed medications for heart disease. Reviews the medication, class of the drug, and side effects. (all other drug classes)    Go Sex-Intimacy & Heart Disease, Get SMART - Goal Setting: - Group verbal and written instruction through game format to discuss heart disease and the return to sexual intimacy. Provides group verbal and written material to discuss and apply goal setting through the application of the S.M.A.R.T. Method.   Cardiac Rehab from 01/26/2018 in [facility] Cardiac and Pulmonary Rehab  Date  12/15/17  Educator  CE  Instruction  Review Code  1- Verbalizes Understanding      Other Matters of the Heart: - Provides group verbal, written materials and models to describe Stable Angina and Peripheral Artery. Includes description of the disease process and treatment options available to the cardiac patient.   Exercise & Equipment Safety: - Individual verbal instruction and demonstration of equipment use and safety with use of the equipment.   Cardiac Rehab from 01/26/2018 in [facility] Cardiac and Pulmonary Rehab  Date  12/08/17  Educator  [facility]  Instruction Review Code  1- Verbalizes Understanding      Infection Prevention: - Provides verbal and written material to individual with discussion of infection control including proper hand washing and proper equipment cleaning during exercise session.   Cardiac Rehab from 01/26/2018 in [facility] Cardiac and Pulmonary Rehab  Date  12/08/17  Educator  [facility]  Instruction Review Code  1- Verbalizes Understanding      Falls Prevention: - Provides verbal and written material to individual with discussion of falls prevention and safety.   Cardiac Rehab from 01/26/2018 in [facility] Cardiac and Pulmonary Rehab  Date  12/08/17  Educator  [facility]  Instruction Review Code  1- Verbalizes Understanding      Diabetes: - Individual verbal and written instruction to review signs/symptoms of diabetes, desired ranges of glucose level fasting, after meals and with exercise. Acknowledge that pre and post exercise glucose checks will be done for 3 sessions at entry of program.   Cardiac Rehab from 01/26/2018 in [facility] Cardiac and Pulmonary Rehab  Date  12/08/17  Educator  [facility]  Instruction Review Code  1- Verbalizes Understanding      Know Your Numbers and Risk Factors: -Group verbal and written instruction about important numbers in your health.  Discussion of what are risk factors and how they play a role in the  disease process.  Review of Cholesterol, Blood Pressure, Diabetes, and BMI and the role they play in  your overall health.   Sleep Hygiene: -Provides group verbal and written instruction about how sleep can affect your health.  Define sleep hygiene, discuss sleep cycles and impact of sleep habits. Review good sleep hygiene tips.    Cardiac Rehab from 01/26/2018 in [facility] Cardiac and Pulmonary Rehab  Date  01/24/18  Educator  [facility]  Instruction Review Code  1- Verbalizes Understanding      Other: -Provides group and verbal instruction on various topics (see comments)   Knowledge Questionnaire Score: Knowledge Questionnaire Score - 12/15/17 1053      Knowledge Questionnaire Score   Pre Score  21/28       Core Components/Risk Factors/Patient Goals at Admission: Personal Goals and Risk Factors at Admission - 12/08/17 1501      Core Components/Risk Factors/Patient Goals on Admission    Weight Management  Yes;Weight Maintenance    Intervention  Weight Management: Develop a combined nutrition and exercise program designed to reach desired caloric intake, while maintaining appropriate intake of nutrient and fiber, sodium and fats, and appropriate energy expenditure required for the weight goal.;Weight Management: Provide education and appropriate resources to help participant work on and attain dietary goals.    Admit Weight  156 lb (70.8 kg)    Goal Weight: Long Term  156 lb (70.8 kg)    Expected Outcomes  Short Term: Continue to assess and modify interventions until short term weight is achieved;Long Term: Adherence to nutrition and physical activity/exercise program aimed toward attainment of established weight goal;Weight Maintenance: Understanding of the daily nutrition guidelines, which includes 25-35% calories from fat, 7% or less cal from saturated fats, less than '200mg'$  cholesterol, less than 1.5gm of sodium, & 5 or more servings of fruits and vegetables daily;Understanding recommendations for meals to include 15-35% energy as protein, 25-35% energy from fat, 35-60% energy from  carbohydrates, less than '200mg'$  of dietary cholesterol, 20-35 gm of total fiber daily;Understanding of distribution of calorie intake throughout the day with the consumption of 4-5 meals/snacks    Heart Failure  Yes    Intervention  Provide a combined exercise and nutrition program that is supplemented with education, support and counseling about heart failure. Directed toward relieving symptoms such as shortness of breath, decreased exercise tolerance, and extremity edema.    Expected Outcomes  Improve functional capacity of life;Short term: Attendance in program 2-3 days a week with increased exercise capacity. Reported lower sodium intake. Reported increased fruit and vegetable intake. Reports medication compliance.;Short term: Daily weights obtained and reported for increase. Utilizing diuretic protocols set by physician.;Long term: Adoption of self-care skills and reduction of barriers for early signs and symptoms recognition and intervention leading to self-care maintenance.    Hypertension  Yes    Intervention  Provide education on lifestyle modifcations including regular physical activity/exercise, weight management, moderate sodium restriction and increased consumption of fresh fruit, vegetables, and low fat dairy, alcohol moderation, and smoking cessation.;Monitor prescription use compliance.    Expected Outcomes  Short Term: Continued assessment and intervention until BP is < 140/22m HG in hypertensive participants. < 130/818mHG in hypertensive participants with diabetes, heart failure or chronic kidney disease.;Long Term: Maintenance of blood pressure at goal levels.    Lipids  Yes    Intervention  Provide education and support for participant on nutrition & aerobic/resistive exercise along with prescribed medications to achieve LDL '70mg'$ , HDL >'40mg'$ .    Expected  Outcomes  Short Term: Participant states understanding of desired cholesterol values and is compliant with medications prescribed.  Participant is following exercise prescription and nutrition guidelines.;Long Term: Cholesterol controlled with medications as prescribed, with individualized exercise RX and with personalized nutrition plan. Value goals: LDL < 3m, HDL > 40 mg.       Core Components/Risk Factors/Patient Goals Review:  Goals and Risk Factor Review    Row Name 12/22/17 1039 01/24/18 0859           Core Components/Risk Factors/Patient Goals Review   Personal Goals Review  Weight Management/Obesity;Heart Failure;Hypertension;Lipids  Weight Management/Obesity;Heart Failure;Hypertension;Lipids      Review  CStephfonhas been doing well in rehab.  His blood pressures have been good.  He (or his wife) checks them frequently at home. His weight has been steady and he checks it daily at home.  He has not had any heart failure symptoms.  He is doing well with his medications and had a medication reconcilliation over the phone yesterday.  CByrdcontinues to do well in rehab.  His weight has been staying steady around 155 lbs here and between 149-153 at home.  He and his wife monitor his weight and blood pressure daily as part of his heart failure routine. Recently, he has not had any symptoms of heart failure.  He feels that his medications are working well for him.       Expected Outcomes  Short: Continue to monitor heart failure symptoms daily.   Long: Continue to work on risk factors  Short: Continue to monitor weight daily to keep eye on heart failure.  Long: Continue to monitor risk factors.         Core Components/Risk Factors/Patient Goals at Discharge (Final Review):  Goals and Risk Factor Review - 01/24/18 0859      Core Components/Risk Factors/Patient Goals Review   Personal Goals Review  Weight Management/Obesity;Heart Failure;Hypertension;Lipids    Review  CKintacontinues to do well in rehab.  His weight has been staying steady around 155 lbs here and between 149-153 at home.  He and his wife monitor  his weight and blood pressure daily as part of his heart failure routine. Recently, he has not had any symptoms of heart failure.  He feels that his medications are working well for him.     Expected Outcomes  Short: Continue to monitor weight daily to keep eye on heart failure.  Long: Continue to monitor risk factors.       ITP Comments: ITP Comments    Row Name 12/08/17 1437 01/03/18 1024 01/04/18 0637 01/05/18 0941 01/06/18 0727   ITP Comments  Med Review completed. Initial ITP created. Diagnosis can be found in Media Tab  Pt's wife called and left a message that CClaronwas still not feeling well and would miss class again today.   30 day review completed. Continue with ITP unless diercted changes per Medical Director.   3 visit this month  Will fax to [place]4 beat run of vtach and little more short of breath this week he said  Addendum-[place] on 01/05/2018 for vtach and shortness of breath. See Emerg Dept note:Plan: Patient had presented for dyspnea and PVCs seen in cardiac rehab. Patient's labs were within normal limits. 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  He has had occasional PVC here but no coupled beats or suggestions of V. tach.  These findings are chronic  from the family's description.  He is cleared for outpatient follow-up with his cardiologist   Row Name 01/06/18 1514 02/01/18 0640         ITP Comments  Dr Lawana Pai from [facility] cardiology called to let us know that Jared Castaneda is cleared to resume rehab on Tuesday as he checked out fine in the clinic.  He was doing better today.  Dr. Lawana Pai said to call him with any further needs and he can be reached at [phone] ext [phone]  30 Day review. Continue with ITP unless directed changes per Medical Director review.           Comments:

## 2018-02-02 DIAGNOSIS — I2510 Atherosclerotic heart disease of native coronary artery without angina pectoris: Secondary | ICD-10-CM | POA: Diagnosis not present

## 2018-02-02 DIAGNOSIS — Z955 Presence of coronary angioplasty implant and graft: Secondary | ICD-10-CM

## 2018-02-02 NOTE — Progress Notes (Signed)
Daily Session Note  Patient Details  Name: Jared Castaneda MRN: [id number] Date of Birth: 1929/09/01 Referring Provider:     Cardiac Rehab from 12/08/2017 in [facility] Cardiac and Pulmonary Rehab  Referring Provider  [place]      Encounter Date: 02/02/2018  Check In: Session Check In - 02/02/18 0843      Check-In   Location  [facility]-Cardiac & Pulmonary Rehab    Staff Present  Alberteen Sam, MA, RCEP, CCRP, Exercise Physiologist;Amanda Oletta Darter, BA, ACSM CEP, Exercise Physiologist;Carroll Enterkin, RN, BSN    Supervising physician immediately available to respond to emergencies  See telemetry face sheet for immediately available ER MD    Medication changes reported      No    Fall or balance concerns reported     No    Warm-up and Cool-down  Performed on first and last piece of equipment    Resistance Training Performed  Yes    VAD Patient?  No      Pain Assessment   Currently in Pain?  No/denies    Multiple Pain Sites  No          Social History   Tobacco Use  Smoking Status Former Smoker  . Last attempt to quit: 1984  . Years since quitting: 35.2  Smokeless Tobacco Never Used    Goals Met:  Independence with exercise equipment Exercise tolerated well No report of cardiac concerns or symptoms Strength training completed today  Goals Unmet:  Not Applicable  Comments: Pt able to follow exercise prescription today without complaint.  Will continue to monitor for progression.    Dr. Emily Filbert is Medical Director for [place] and [facility].

## 2018-02-07 ENCOUNTER — Encounter: Payer: Non-veteran care | Attending: Cardiology | Admitting: *Deleted

## 2018-02-07 DIAGNOSIS — I2510 Atherosclerotic heart disease of native coronary artery without angina pectoris: Secondary | ICD-10-CM | POA: Insufficient documentation

## 2018-02-07 DIAGNOSIS — Z955 Presence of coronary angioplasty implant and graft: Secondary | ICD-10-CM

## 2018-02-07 NOTE — Progress Notes (Signed)
Daily Session Note  Patient Details  Name: Jared Castaneda MRN: [id number] Date of Birth: 07/02/1929 Referring Provider:     Cardiac Rehab from 12/08/2017 in [facility] Cardiac and Pulmonary Rehab  Referring Provider  [place]      Encounter Date: 02/07/2018  Check In: Session Check In - 02/07/18 0842      Check-In   Location  [facility]-Cardiac & Pulmonary Rehab    Staff Present  Heath Lark, RN, BSN, CCRP;Catelin Manthe Knobel, MA, RCEP, CCRP, Exercise Physiologist;Amanda Oletta Darter, [place], ACSM CEP, Exercise Physiologist    Supervising physician immediately available to respond to emergencies  See telemetry face sheet for immediately available ER MD    Medication changes reported      No    Fall or balance concerns reported     No    Warm-up and Cool-down  Performed on first and last piece of equipment    Resistance Training Performed  Yes    VAD Patient?  No      Pain Assessment   Currently in Pain?  No/denies          Social History   Tobacco Use  Smoking Status Former Smoker  . Last attempt to quit: 1984  . Years since quitting: 35.2  Smokeless Tobacco Never Used    Goals Met:  Independence with exercise equipment Exercise tolerated well No report of cardiac concerns or symptoms Strength training completed today  Goals Unmet:  Not Applicable  Comments: Pt able to follow exercise prescription today without complaint.  Will continue to monitor for progression.    Dr. Emily Filbert is Medical Director for [place] and [facility].

## 2018-02-09 ENCOUNTER — Encounter: Payer: Non-veteran care | Admitting: *Deleted

## 2018-02-09 DIAGNOSIS — Z955 Presence of coronary angioplasty implant and graft: Secondary | ICD-10-CM

## 2018-02-09 DIAGNOSIS — I2510 Atherosclerotic heart disease of native coronary artery without angina pectoris: Secondary | ICD-10-CM | POA: Diagnosis not present

## 2018-02-09 NOTE — Progress Notes (Signed)
Daily Session Note  Patient Details  Name: Jared Castaneda MRN: [id number] Date of Birth: 21-Mar-1929 Referring Provider:     Cardiac Rehab from 12/08/2017 in [facility] Cardiac and Pulmonary Rehab  Referring Provider  [place]      Encounter Date: 02/09/2018  Check In: Session Check In - 02/09/18 0831      Check-In   Location  [facility]-Cardiac & Pulmonary Rehab    Staff Present  Alberteen Sam, MA, RCEP, CCRP, Exercise Physiologist;Amanda Oletta Darter, BA, ACSM CEP, Exercise Physiologist;Carroll Enterkin, RN, BSN    Supervising physician immediately available to respond to emergencies  See telemetry face sheet for immediately available ER MD    Medication changes reported      No    Fall or balance concerns reported     No    Warm-up and Cool-down  Performed on first and last piece of equipment    Resistance Training Performed  Yes    VAD Patient?  No      Pain Assessment   Currently in Pain?  No/denies        Exercise Prescription Changes - 02/08/18 1400      Response to Exercise   Blood Pressure (Admit)  126/68    Blood Pressure (Exercise)  134/70    Blood Pressure (Exit)  108/56    Heart Rate (Admit)  67 bpm    Heart Rate (Exercise)  94 bpm    Heart Rate (Exit)  66 bpm    Rating of Perceived Exertion (Exercise)  10    Symptoms  none    Duration  Continue with 30 min of aerobic exercise without signs/symptoms of physical distress.    Intensity  THRR unchanged      Progression   Progression  Continue to progress workloads to maintain intensity without signs/symptoms of physical distress.    Average METs  2.29      Resistance Training   Training Prescription  Yes    Weight  2 lbs    Reps  10-15      Interval Training   Interval Training  No      NuStep   Level  3    Minutes  15    METs  2.5      Biostep-RELP   Level  3    Minutes  15    METs  2      Track   Laps  30    Minutes  15    METs  2.38      Home Exercise Plan   Plans to continue exercise at  Home  (comment) walking    Frequency  Add 3 additional days to program exercise sessions.    Initial Home Exercises Provided  12/22/17       Social History   Tobacco Use  Smoking Status Former Smoker  . Last attempt to quit: 1984  . Years since quitting: 35.2  Smokeless Tobacco Never Used    Goals Met:  Independence with exercise equipment Exercise tolerated well No report of cardiac concerns or symptoms Strength training completed today  Goals Unmet:  Not Applicable  Comments: Pt able to follow exercise prescription today without complaint.  Will continue to monitor for progression.    Dr. Emily Filbert is Medical Director for [place] and [facility].

## 2018-02-14 DIAGNOSIS — Z955 Presence of coronary angioplasty implant and graft: Secondary | ICD-10-CM

## 2018-02-14 DIAGNOSIS — I2510 Atherosclerotic heart disease of native coronary artery without angina pectoris: Secondary | ICD-10-CM | POA: Diagnosis not present

## 2018-02-14 NOTE — Progress Notes (Signed)
Daily Session Note  Patient Details  Name: Jared Castaneda MRN: [id number] Date of Birth: 01/31/1929 Referring Provider:     Cardiac Rehab from 12/08/2017 in [facility] Cardiac and Pulmonary Rehab  Referring Provider  [place]      Encounter Date: 02/14/2018  Check In: Session Check In - 02/14/18 0840      Check-In   Location  [facility]-Cardiac & Pulmonary Rehab    Staff Present  Alberteen Sam, MA, RCEP, CCRP, Exercise Physiologist;Korin Setzler Oletta Darter, BA, ACSM CEP, Exercise Physiologist;Susanne Bice, RN, BSN, CCRP    Supervising physician immediately available to respond to emergencies  See telemetry face sheet for immediately available ER MD    Medication changes reported      No    Fall or balance concerns reported     No    Warm-up and Cool-down  Performed on first and last piece of equipment    Resistance Training Performed  Yes    VAD Patient?  No      Pain Assessment   Currently in Pain?  No/denies    Multiple Pain Sites  No          Social History   Tobacco Use  Smoking Status Former Smoker  . Last attempt to quit: 1984  . Years since quitting: 35.2  Smokeless Tobacco Never Used    Goals Met:  Independence with exercise equipment Exercise tolerated well Personal goals reviewed No report of cardiac concerns or symptoms Strength training completed today  Goals Unmet:  Not Applicable  Comments: Pt able to follow exercise prescription today without complaint.  Will continue to monitor for progression.    Dr. Emily Filbert is Medical Director for [place] and [facility].

## 2018-02-16 DIAGNOSIS — Z955 Presence of coronary angioplasty implant and graft: Secondary | ICD-10-CM

## 2018-02-16 DIAGNOSIS — I2510 Atherosclerotic heart disease of native coronary artery without angina pectoris: Secondary | ICD-10-CM | POA: Diagnosis not present

## 2018-02-16 NOTE — Progress Notes (Signed)
Daily Session Note  Patient Details  Name: Jared Castaneda MRN: [id number] Date of Birth: Feb 13, 1929 Referring Provider:     Cardiac Rehab from 12/08/2017 in [facility] Cardiac and Pulmonary Rehab  Referring Provider  [place]      Encounter Date: 02/16/2018  Check In:      Social History   Tobacco Use  Smoking Status Former Smoker  . Last attempt to quit: 1984  . Years since quitting: 35.2  Smokeless Tobacco Never Used    Goals Met:  Independence with exercise equipment Exercise tolerated well No report of cardiac concerns or symptoms Strength training completed today  Goals Unmet:  Not Applicable  Comments: Pt able to follow exercise prescription today without complaint.  Will continue to monitor for progression.    Dr. Emily Filbert is Medical Director for [place] and [facility].

## 2018-02-21 ENCOUNTER — Encounter: Payer: Non-veteran care | Admitting: *Deleted

## 2018-02-21 VITALS — Ht 69.5 in | Wt 155.0 lb

## 2018-02-21 DIAGNOSIS — I2510 Atherosclerotic heart disease of native coronary artery without angina pectoris: Secondary | ICD-10-CM | POA: Diagnosis not present

## 2018-02-21 DIAGNOSIS — Z955 Presence of coronary angioplasty implant and graft: Secondary | ICD-10-CM

## 2018-02-21 NOTE — Progress Notes (Signed)
Daily Session Note  Patient Details  Name: Jared Castaneda MRN: [id number] Date of Birth: 10/23/1929 Referring Provider:     Cardiac Rehab from 12/08/2017 in [facility]  Referring Provider  [place]      Encounter Date: 02/21/2018  Check In: Session Check In - 02/21/18 1022      Check-In   Location  [facility]    Staff Present  Susanne Bice, RN, BSN, CCRP;Jessica Hawkins, MA, RCEP, CCRP, Exercise Physiologist;Amanda Sommer, BA, ACSM CEP, Exercise Physiologist    Supervising physician immediately available to respond to emergencies  See telemetry face sheet for immediately available ER MD    Medication changes reported      No    Fall or balance concerns reported     No    Warm-up and Cool-down  Performed on first and last piece of equipment    Resistance Training Performed  Yes    VAD Patient?  No          Social History   Tobacco Use  Smoking Status Former Smoker  . Last attempt to quit: 1984  . Years since quitting: 35.3  Smokeless Tobacco Never Used    Goals Met:  Independence with exercise equipment Exercise tolerated well No report of cardiac concerns or symptoms Strength training completed today  Goals Unmet:  Not Applicable  Comments: Pt able to follow exercise prescription today without complaint.  Will continue to monitor for progression.  6 Minute Walk    Row Name 12/08/17 1308 02/21/18 1025       6 Minute Walk   Phase  Initial  Discharge    Distance  742 feet  867 feet    Distance % Change  -  16.8 %    Distance Feet Change  -  125 ft    Walk Time  6 minutes  6 minutes    # of Rest Breaks  0  0    MPH  1.4  1.64    METS  1.36  1.85    RPE  13  12    Perceived Dyspnea   1  1    VO2 Peak  4.76  6.47    Symptoms  Yes (comment)  No    Comments  2/10 hip arthritis  used his cane  -    Resting HR  59 bpm  75 bpm    Resting BP  112/64  142/60    Resting Oxygen Saturation   98 % Room air  100 %    Exercise Oxygen Saturation  during 6 min walk  92 % Room air  100 %    Max Ex. HR  -  105 bpm    Max Ex. BP  136/56  154/70        Dr. Mark Miller is Medical Director for [facility] and [facility]. 

## 2018-02-23 ENCOUNTER — Encounter: Payer: Non-veteran care | Admitting: *Deleted

## 2018-02-23 DIAGNOSIS — I2510 Atherosclerotic heart disease of native coronary artery without angina pectoris: Secondary | ICD-10-CM | POA: Diagnosis not present

## 2018-02-23 DIAGNOSIS — Z955 Presence of coronary angioplasty implant and graft: Secondary | ICD-10-CM

## 2018-02-23 NOTE — Progress Notes (Signed)
Daily Session Note  Patient Details  Name: Nirvaan Frett MRN: [id number] Date of Birth: [birth year] Referring Provider:     Cardiac Rehab from 12/08/2017 in [facility]  Referring Provider  [place]      Encounter Date: 02/23/2018  Check In: Session Check In - 02/23/18 0907      Check-In   Location  [facility]    Staff Present  Alberteen Sam, MA, RCEP, CCRP, Exercise Physiologist;Amanda Oletta Darter, BA, ACSM CEP, Exercise Physiologist;Carroll Enterkin, RN, BSN    Supervising physician immediately available to respond to emergencies  See telemetry face sheet for immediately available ER MD    Medication changes reported      No    Fall or balance concerns reported     No    Warm-up and Cool-down  Performed on first and last piece of equipment    Resistance Training Performed  Yes    VAD Patient?  No      Pain Assessment   Currently in Pain?  No/denies          Social History   Tobacco Use  Smoking Status Former Smoker  . Last attempt to quit: 1984  . Years since quitting: 35.3  Smokeless Tobacco Never Used    Goals Met:  Independence with exercise equipment Exercise tolerated well No report of cardiac concerns or symptoms Strength training completed today  Goals Unmet:  Not Applicable  Comments: Pt able to follow exercise prescription today without complaint.  Will continue to monitor for progression.    Dr. Emily Filbert is Medical Director for [facility] and [facility].

## 2018-02-28 ENCOUNTER — Encounter: Payer: Non-veteran care | Admitting: *Deleted

## 2018-02-28 DIAGNOSIS — I2510 Atherosclerotic heart disease of native coronary artery without angina pectoris: Secondary | ICD-10-CM | POA: Diagnosis not present

## 2018-02-28 DIAGNOSIS — Z955 Presence of coronary angioplasty implant and graft: Secondary | ICD-10-CM

## 2018-02-28 NOTE — Progress Notes (Signed)
Daily Session Note  Patient Details  Name: Jared Castaneda MRN: [id number] Date of Birth: 03-Feb-1929 Referring Provider:     Cardiac Rehab from 12/08/2017 in [facility]  Referring Provider  [place]      Encounter Date: 02/28/2018  Check In: Session Check In - 02/28/18 0830      Check-In   Location  [facility]    Staff Present  Heath Lark, RN, BSN, CCRP;Treg Diemer [place], MA, RCEP, CCRP, Exercise Physiologist;Amanda Oletta Darter, [place], ACSM CEP, Exercise Physiologist    Supervising physician immediately available to respond to emergencies  See telemetry face sheet for immediately available ER MD    Medication changes reported      No    Fall or balance concerns reported     No    Warm-up and Cool-down  Performed on first and last piece of equipment    Resistance Training Performed  Yes    VAD Patient?  No      Pain Assessment   Currently in Pain?  No/denies          Social History   Tobacco Use  Smoking Status Former Smoker  . Last attempt to quit: 1984  . Years since quitting: 35.3  Smokeless Tobacco Never Used    Goals Met:  Independence with exercise equipment Exercise tolerated well No report of cardiac concerns or symptoms Strength training completed today  Goals Unmet:  Not Applicable  Comments: Pt able to follow exercise prescription today without complaint.  Will continue to monitor for progression.    Dr. Emily Filbert is Medical Director for [facility] and [facility].

## 2018-03-01 ENCOUNTER — Encounter: Payer: Self-pay | Admitting: *Deleted

## 2018-03-01 DIAGNOSIS — Z955 Presence of coronary angioplasty implant and graft: Secondary | ICD-10-CM

## 2018-03-01 NOTE — Progress Notes (Signed)
Cardiac Individual Treatment Plan  Patient Details  Name: Jared Castaneda MRN: [id number] Date of Birth: [birth year] Referring Provider:     Cardiac Rehab from 12/08/2017 in [facility]  Referring Provider  [place]      Initial Encounter Date:    Cardiac Rehab from 12/08/2017 in [facility] Cardiac and Pulmonary Rehab  Date  12/08/17  Referring Provider  Lawana Pai      Visit Diagnosis: Status post coronary artery stent placement  Patient's Home Medications on Admission:  Current Outpatient Medications:  .  acetaminophen (TYLENOL) 325 MG tablet, Take 650 mg by mouth every 6 (six) hours as needed., Disp: , Rfl:  .  apixaban (ELIQUIS) 5 MG TABS tablet, Take 5 mg by mouth 2 (two) times daily., Disp: , Rfl:  .  atorvastatin (LIPITOR) 80 MG tablet, Take 80 mg by mouth at bedtime., Disp: , Rfl:  .  carvedilol (COREG) 6.25 MG tablet, Take 6.25 mg by mouth 2 (two) times daily with a meal., Disp: , Rfl:  .  clopidogrel (PLAVIX) 75 MG tablet, Take 75 mg by mouth daily., Disp: , Rfl:  .  docusate sodium (COLACE) 100 MG capsule, Take 100 mg by mouth daily., Disp: , Rfl:  .  furosemide (LASIX) 20 MG tablet, Take 20 mg by mouth 2 (two) times daily as needed for fluid ("for Weigth Gain of more than 3 pounds or shortness of breath)., Disp: , Rfl:  .  magnesium oxide (MAG-OX) 400 MG tablet, Take 400 mg by mouth daily., Disp: , Rfl:  .  nitroGLYCERIN (NITROSTAT) 0.4 MG SL tablet, Place 0.4 mg under the tongue every 5 (five) minutes as needed for chest pain., Disp: , Rfl:   Past Medical History: Past Medical History:  Diagnosis Date  . A-fib ([place])   . CLL (chronic lymphocytic leukemia) ([place])   . Coronary artery disease   . Diabetes mellitus without complication ([place])   . Hypertension   . Thyroid disease     Tobacco Use: Social History   Tobacco Use  Smoking Status Former Smoker  . Last attempt to quit: 1984  . Years since quitting: 35.3  Smokeless Tobacco Never Used     Labs: Recent Review Flowsheet Data    There is no flowsheet data to display.       Exercise Target Goals:    Exercise Program Goal: Individual exercise prescription set using results from initial 6 min walk test and THRR while considering  patient's activity barriers and safety.   Exercise Prescription Goal: Initial exercise prescription builds to 30-45 minutes a day of aerobic activity, 2-3 days per week.  Home exercise guidelines will be given to patient during program as part of exercise prescription that the participant will acknowledge.  Activity Barriers & Risk Stratification: Activity Barriers & Cardiac Risk Stratification - 12/08/17 1514      Activity Barriers & Cardiac Risk Stratification   Activity Barriers  Arthritis;Shortness of Breath    Cardiac Risk Stratification  High       6 Minute Walk: 6 Minute Walk    Row Name 12/08/17 1308 02/21/18 1025       6 Minute Walk   Phase  Initial  Discharge    Distance  742 feet  867 feet    Distance % Change  -  16.8 %    Distance Feet Change  -  125 ft    Walk Time  6 minutes  6 minutes    # of Rest Breaks  0  0    MPH  1.4  1.64    METS  1.36  1.85    RPE  13  12    Perceived Dyspnea   1  1    VO2 Peak  4.76  6.47    Symptoms  Yes (comment)  No    Comments  2/10 hip arthritis  used his cane  -    Resting HR  59 bpm  75 bpm    Resting BP  112/64  142/60    Resting Oxygen Saturation   98 % Room air  100 %    Exercise Oxygen Saturation  during 6 min walk  92 % Room air  100 %    Max Ex. HR  -  105 bpm    Max Ex. BP  136/56  154/70       Oxygen Initial Assessment:   Oxygen Re-Evaluation:   Oxygen Discharge (Final Oxygen Re-Evaluation):   Initial Exercise Prescription: Initial Exercise Prescription - 12/08/17 1400      Date of Initial Exercise RX and Referring Provider   Date  12/08/17    Referring Provider  [place]      Treadmill   MPH  1    Grade  0    Minutes  15      NuStep   Level  1     SPM  80    Minutes  15    METs  1.3      Biostep-RELP   Level  1    SPM  50    Minutes  15    METs  1.3      Track   Laps  11    Minutes  15    METs  1.5      Prescription Details   Frequency (times per week)  3    Duration  Progress to 30 minutes of continuous aerobic without signs/symptoms of physical distress      Intensity   THRR 40-80% of Max Heartrate  89-119    Ratings of Perceived Exertion  11-13    Perceived Dyspnea  0-4      Resistance Training   Training Prescription  Yes    Weight  2 lb    Reps  10-15       Perform Capillary Blood Glucose checks as needed.  Exercise Prescription Changes: Exercise Prescription Changes    Row Name 12/08/17 1500 12/22/17 1000 12/27/17 1600 01/11/18 0800 01/24/18 1500     Response to Exercise   Blood Pressure (Admit)  112/64  -  126/64  136/60  136/72   Blood Pressure (Exercise)  136/56  -  138/68  122/60  134/60   Blood Pressure (Exit)  -  -  122/54  128/60  114/60   Heart Rate (Admit)  71 bpm  -  86 bpm  68 bpm  71 bpm   Heart Rate (Exercise)  93 bpm  -  95 bpm  102 bpm  98 bpm   Heart Rate (Exit)  69 bpm  -  68 bpm  72 bpm  65 bpm   Oxygen Saturation (Admit)  98 %  -  -  -  -   Oxygen Saturation (Exercise)  92 %  -  -  -  -   Rating of Perceived Exertion (Exercise)  13  -  '12  11  10   '$ Symptoms  -  -  none  none  none  Comments  -  -  third full day of exercise  -  -   Duration  -  -  Continue with 30 min of aerobic exercise without signs/symptoms of physical distress.  Continue with 30 min of aerobic exercise without signs/symptoms of physical distress.  Continue with 30 min of aerobic exercise without signs/symptoms of physical distress.   Intensity  -  -  THRR unchanged  THRR unchanged  THRR unchanged     Progression   Progression  -  -  Continue to progress workloads to maintain intensity without signs/symptoms of physical distress.  Continue to progress workloads to maintain intensity without signs/symptoms of  physical distress.  Continue to progress workloads to maintain intensity without signs/symptoms of physical distress.   Average METs  -  -  2.17  2.15  2.25     Resistance Training   Training Prescription  -  -  Yes  Yes  Yes   Weight  -  -  2 lb  2 lbs  2 lbs   Reps  -  -  10-15  10-15  10-15     Interval Training   Interval Training  -  -  No  No  No     NuStep   Level  -  -  '3  3  3   '$ Minutes  -  -  '15  15  15   '$ METs  -  -  2.4  2.3  2.5     Biostep-RELP   Level  -  -  '1  3  3   '$ Minutes  -  -  '15  15  15   '$ METs  -  -  '2  2  2     '$ Track   Laps  -  -  24  -  -   Minutes  -  -  15  -  -   METs  -  -  2.11  -  -     Home Exercise Plan   Plans to continue exercise at  -  Home (comment) walking  Home (comment) walking  Home (comment) walking  Home (comment) walking   Frequency  -  Add 3 additional days to program exercise sessions.  Add 3 additional days to program exercise sessions.  Add 3 additional days to program exercise sessions.  Add 3 additional days to program exercise sessions.   Initial Home Exercises Provided  -  12/22/17  12/22/17  12/22/17  12/22/17   Row Name 02/08/18 1400 02/21/18 1600           Response to Exercise   Blood Pressure (Admit)  126/68  142/60      Blood Pressure (Exercise)  134/70  155/70      Blood Pressure (Exit)  108/56  126/70      Heart Rate (Admit)  67 bpm  70 bpm      Heart Rate (Exercise)  94 bpm  105 bpm      Heart Rate (Exit)  66 bpm  59 bpm      Rating of Perceived Exertion (Exercise)  10  12      Symptoms  none  none      Duration  Continue with 30 min of aerobic exercise without signs/symptoms of physical distress.  Continue with 30 min of aerobic exercise without signs/symptoms of physical distress.      Intensity  THRR unchanged  THRR unchanged  Progression   Progression  Continue to progress workloads to maintain intensity without signs/symptoms of physical distress.  Continue to progress workloads to maintain intensity  without signs/symptoms of physical distress.      Average METs  2.29  3.06        Resistance Training   Training Prescription  Yes  Yes      Weight  2 lbs  3 lbs      Reps  10-15  10-15        Interval Training   Interval Training  No  No        NuStep   Level  3  6      Minutes  15  15      METs  2.5  3.8        Biostep-RELP   Level  3  4      Minutes  15  15      METs  2  2        Track   Laps  30  30      Minutes  15  15      METs  2.38  2.38        Home Exercise Plan   Plans to continue exercise at  Home (comment) walking  Home (comment) walking      Frequency  Add 3 additional days to program exercise sessions.  Add 3 additional days to program exercise sessions.      Initial Home Exercises Provided  12/22/17  12/22/17         Exercise Comments: Exercise Comments    Row Name 12/15/17 1053 12/22/17 0945         Exercise Comments  First full day of exercise!  Patient was oriented to gym and equipment including functions, settings, policies, and procedures.  Patient's individual exercise prescription and treatment plan were reviewed.  All starting workloads were established based on the results of the 6 minute walk test done at initial orientation visit.  The plan for exercise progression was also introduced and progression will be customized based on patient's performance and goals.  Reviewed home exercise with pt today.  Pt plans to walk and do stretches for exercise.  Reviewed THR, pulse, RPE, sign and symptoms, NTG use, and when to call 911 or MD.  Also discussed weather considerations and indoor options.  Pt voiced understanding.         Exercise Goals and Review: Exercise Goals    Row Name 12/08/17 1436             Exercise Goals   Increase Physical Activity  Yes       Intervention  Provide advice, education, support and counseling about physical activity/exercise needs.;Develop an individualized exercise prescription for aerobic and resistive training  based on initial evaluation findings, risk stratification, comorbidities and participant's personal goals.       Expected Outcomes  Short Term: Attend rehab on a regular basis to increase amount of physical activity.;Long Term: Add in home exercise to make exercise part of routine and to increase amount of physical activity.;Long Term: Exercising regularly at least 3-5 days a week.       Increase Strength and Stamina  Yes       Intervention  Provide advice, education, support and counseling about physical activity/exercise needs.;Develop an individualized exercise prescription for aerobic and resistive training based on initial evaluation findings, risk stratification, comorbidities and participant's personal goals.  Expected Outcomes  Short Term: Increase workloads from initial exercise prescription for resistance, speed, and METs.;Short Term: Perform resistance training exercises routinely during rehab and add in resistance training at home;Long Term: Improve cardiorespiratory fitness, muscular endurance and strength as measured by increased METs and functional capacity (6MWT)       Able to understand and use rate of perceived exertion (RPE) scale  Yes       Intervention  Provide education and explanation on how to use RPE scale       Expected Outcomes  Short Term: Able to use RPE daily in rehab to express subjective intensity level;Long Term:  Able to use RPE to guide intensity level when exercising independently       Able to understand and use Dyspnea scale  Yes       Intervention  Provide education and explanation on how to use Dyspnea scale       Expected Outcomes  Short Term: Able to use Dyspnea scale daily in rehab to express subjective sense of shortness of breath during exertion;Long Term: Able to use Dyspnea scale to guide intensity level when exercising independently       Knowledge and understanding of Target Heart Rate Range (THRR)  Yes       Intervention  Provide education and  explanation of THRR including how the numbers were predicted and where they are located for reference       Expected Outcomes  Long Term: Able to use THRR to govern intensity when exercising independently;Short Term: Able to state/look up THRR;Short Term: Able to use daily as guideline for intensity in rehab       Able to check pulse independently  Yes       Intervention  Provide education and demonstration on how to check pulse in carotid and radial arteries.;Review the importance of being able to check your own pulse for safety during independent exercise       Expected Outcomes  Short Term: Able to explain why pulse checking is important during independent exercise;Long Term: Able to check pulse independently and accurately       Understanding of Exercise Prescription  Yes       Intervention  Provide education, explanation, and written materials on patient's individual exercise prescription       Expected Outcomes  Short Term: Able to explain program exercise prescription;Long Term: Able to explain home exercise prescription to exercise independently          Exercise Goals Re-Evaluation : Exercise Goals Re-Evaluation    Row Name 12/15/17 1053 12/22/17 0945 01/11/18 0835 01/24/18 0854 02/08/18 1449     Exercise Goal Re-Evaluation   Exercise Goals Review  Increase Physical Activity;Increase Strength and Stamina;Understanding of Exercise Prescription;Knowledge and understanding of Target Heart Rate Range (THRR);Able to understand and use rate of perceived exertion (RPE) scale  Increase Physical Activity;Increase Strength and Stamina;Able to understand and use rate of perceived exertion (RPE) scale;Knowledge and understanding of Target Heart Rate Range (THRR);Able to check pulse independently;Understanding of Exercise Prescription  Increase Physical Activity;Understanding of Exercise Prescription;Increase Strength and Stamina  Increase Physical Activity;Understanding of Exercise Prescription;Increase  Strength and Stamina  Increase Physical Activity;Understanding of Exercise Prescription;Increase Strength and Stamina   Comments  Reviewed RPE scale, THR and program prescription with pt today.  Pt voiced understanding and was given a copy of goals to take home  Jared Castaneda has been doing well in rehab.  He is able to do more at home as he recovers his  strength and stamina.  Reviewed home exercise with pt today.  Pt plans to walk and do stretches for exercise.  Reviewed THR, pulse, RPE, sign and symptoms, NTG use, and when to call 911 or MD.  Also discussed weather considerations and indoor options.  Pt voiced understanding.  Jared Castaneda has been doing well in rehab.  He did have a 5 beat run of vtach walking last week, but his doctor would like to see him in class and exercising regularly.  He is up to level 3 on the BioStep.  We will continue to monitor his progression and his rhythm  Jared Castaneda has been doing well in rehab.  He is getting more active and walking every day.  He and his wife have been walking out in the yard and going to the track for about an hour each day.  He still takes some rest breaks, but is going further than ever before.  He feels better overall.   Jared Castaneda continues to do well in rehab.  He continues to walk daily with his wife.  He is back up to 30 laps in rehab again.  We will continue to monitor his progress.    Expected Outcomes  Short: Use RPE daily to regulate intensity.  Long: Follow program prescription in THR.  Short - Pt will continue exercise at home most days and monitor pulse and RPE Long - Pt will exercise on his own  Short: Continue to encourage him to exercise at home.  Long: Continue to increase activity levels.   Short: Continue to exercise daily at home.  Long: Continue to try to increase workloads.   Short: Try level 4 on BioStep.  Long: Continue to walk daily to build strength and Jared Castaneda Name 02/21/18 1632             Exercise Goal Re-Evaluation   Exercise  Goals Review  Increase Physical Activity;Understanding of Exercise Prescription;Increase Strength and Stamina       Comments  Jared Castaneda is nearing graduation!  He improved 16.8%!!  He is doing level 4 on the BioStep.  He is planning to continue to exercise by walking at home.  We will continue to monitor his progress.        Expected Outcomes  Short: Graduate!!!  Long: Continue to walk with wife daily!!          Discharge Exercise Prescription (Final Exercise Prescription Changes): Exercise Prescription Changes - 02/21/18 1600      Response to Exercise   Blood Pressure (Admit)  142/60    Blood Pressure (Exercise)  155/70    Blood Pressure (Exit)  126/70    Heart Rate (Admit)  70 bpm    Heart Rate (Exercise)  105 bpm    Heart Rate (Exit)  59 bpm    Rating of Perceived Exertion (Exercise)  12    Symptoms  none    Duration  Continue with 30 min of aerobic exercise without signs/symptoms of physical distress.    Intensity  THRR unchanged      Progression   Progression  Continue to progress workloads to maintain intensity without signs/symptoms of physical distress.    Average METs  3.06      Resistance Training   Training Prescription  Yes    Weight  3 lbs    Reps  10-15      Interval Training   Interval Training  No      NuStep   Level  6  Minutes  15    METs  3.8      Biostep-RELP   Level  4    Minutes  15    METs  2      Track   Laps  30    Minutes  15    METs  2.38      Home Exercise Plan   Plans to continue exercise at  Home (comment) walking    Frequency  Add 3 additional days to program exercise sessions.    Initial Home Exercises Provided  12/22/17       Nutrition:  Target Goals: Understanding of nutrition guidelines, daily intake of sodium '1500mg'$ , cholesterol '200mg'$ , calories 30% from fat and 7% or less from saturated fats, daily to have 5 or more servings of fruits and vegetables.  Biometrics: Pre Biometrics - 12/08/17 1435      Pre Biometrics    Height  5' 9.5" (1.765 m)    Weight  156 lb (70.8 kg)    Waist Circumference  36.5 inches    Hip Circumference  39.5 inches    Waist to Hip Ratio  0.92 %    BMI (Calculated)  22.71      Post Biometrics - 02/21/18 1027       Post  Biometrics   Height  5' 9.5" (1.765 m)    Weight  155 lb (70.3 kg)    Waist Circumference  35 inches    Hip Circumference  38 inches    Waist to Hip Ratio  0.92 %    BMI (Calculated)  22.57       Nutrition Therapy Plan and Nutrition Goals: Nutrition Therapy & Goals - 12/22/17 1038      Nutrition Therapy   Diet  DASH    Drug/Food Interactions  Statins/Certain Fruits    Protein (specify units)  8    Fiber  30 grams    Whole Grain Foods  2 servings    Saturated Fats  14 max. grams    Fruits and Vegetables  5 servings/day    Sodium  1500 grams      Personal Nutrition Goals   Nutrition Goal  No more than '600mg'$  of sodium per meal / frozen meal    Personal Goal #2  Keep sodium below 200-'300mg'$  for snacks    Personal Goal #3  Choose whole grain foods at least half the time, brown rice for example    Personal Goal #4  Continue to practice reading nutrition facts labels consistently to identify foods high in sodium and fat      Intervention Plan   Intervention  Prescribe, educate and counsel regarding individualized specific dietary modifications aiming towards targeted core components such as weight, hypertension, lipid management, diabetes, heart failure and other comorbidities.    Expected Outcomes  Short Term Goal: Understand basic principles of dietary content, such as calories, fat, sodium, cholesterol and nutrients.;Short Term Goal: A plan has been developed with personal nutrition goals set during dietitian appointment.;Long Term Goal: Adherence to prescribed nutrition plan.       Nutrition Assessments: Nutrition Assessments - 02/21/18 1029      MEDFICTS Scores   Pre Score  29    Post Score  12    Score Difference  -17       Nutrition  Goals Re-Evaluation: Nutrition Goals Re-Evaluation    Row Name 01/24/18 0902 02/14/18 0931           Goals   Nutrition Goal  No more than '600mg'$  of sodium per meal / frozen meal, 200-300 mg snacks, whole grains, reading labels  -      Comment  Jared Castaneda and his wife have been reading food labels more than ever.  They are closely monitoring his salt intake and have really cut back across the boads.  They are also trying to get in more whole grains and cutting back on sweets.  They said they learned a lot at thier nutrition appointment.   Jared Castaneda states he is eating more whole grains and continues to work on reading labels to reduce sodium.  He has cut down on sweets.      Expected Outcome  Short: Continue to try to reduce sodium intake.  Long: Continue to work on Psychologist, clinical for heart healthy diet.   Short - Jared Castaneda will check labels for sodium and continue whole grains  Long - Jared Castaneda will stick to low sodium and include whole grains         Nutrition Goals Discharge (Final Nutrition Goals Re-Evaluation): Nutrition Goals Re-Evaluation - 02/14/18 0931      Goals   Comment  Jared Castaneda states he is eating more whole grains and continues to work on reading labels to reduce sodium.  He has cut down on sweets.    Expected Outcome  Short - Jared Castaneda will check labels for sodium and continue whole grains  Long - Julious will stick to low sodium and include whole grains       Psychosocial: Target Goals: Acknowledge presence or absence of significant depression and/or stress, maximize coping skills, provide positive support system. Participant is able to verbalize types and ability to use techniques and skills needed for reducing stress and depression.   Initial Review & Psychosocial Screening: Initial Psych Review & Screening - 12/08/17 1508      Initial Review   Current issues with  Current Sleep Concerns wakes up around 2-3 am most mornings and struggles going back to sleep. 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  Yes spouse, children, church family      Screening Interventions   Interventions  Encouraged to exercise;Program counselor consult;To provide support and resources with identified psychosocial needs;Provide feedback about the scores to participant    Expected Outcomes  Short Term goal: Utilizing psychosocial counselor, staff and physician to assist with identification of specific Stressors or current issues interfering with healing process. Setting desired goal for each stressor or current issue identified.;Long Term Goal: Stressors or current issues are controlled or eliminated.;Short Term goal: Identification and review with participant of any Quality of Life or Depression concerns found by scoring the questionnaire.;Long Term goal: The participant improves quality of Life and PHQ9 Scores as seen by post scores and/or verbalization of changes       Quality of Life Scores:  Quality of Life - 02/21/18 1030      Quality of Life Scores   Health/Function Pre  20.87 %    Health/Function Post  24.8 %    Health/Function % Change  18.83 %    Socioeconomic Pre  21 %    Socioeconomic Post  30 %    Socioeconomic % Change   42.86 %    Psych/Spiritual Pre  21 %    Psych/Spiritual Post  30 %    Psych/Spiritual % Change  42.86 %    Family Pre  21 %    Family  Post  28.8 %    Family % Change  37.14 %    GLOBAL Pre  20.94 %    GLOBAL Post  27.45 %    GLOBAL % Change  31.09 %      Scores of 19 and below usually indicate a poorer quality of life in these areas.  A difference of  2-3 points is a clinically meaningful difference.  A difference of 2-3 points in the total score of the Quality of Life Index has been associated with significant improvement in overall quality of life, self-image, physical symptoms, and general health in studies assessing change in quality of life.  PHQ-9: Recent Review Flowsheet  Data    Depression screen [facility] 2/9 02/21/2018 12/08/2017   Decreased Interest 3 0   Down, Depressed, Hopeless 0 0   PHQ - 2 Score 3 0   Altered sleeping 0 3   Tired, decreased energy 2 1   Change in appetite 0 0   Feeling bad or failure about yourself  0 0   Trouble concentrating 0 0   Moving slowly or fidgety/restless 0 0   Suicidal thoughts 0 0   PHQ-9 Score 5 4   Difficult doing work/chores Not difficult at all Not difficult at all     Interpretation of Total Score  Total Score Depression Severity:  1-4 = Minimal depression, 5-9 = Mild depression, 10-14 = Moderate depression, 15-19 = Moderately severe depression, 20-27 = Severe depression   Psychosocial Evaluation and Intervention: Psychosocial Evaluation - 01/05/18 1031      Psychosocial Evaluation & Interventions   Interventions  Encouraged to exercise with the program and follow exercise prescription    Comments  Counselor met with Jared Castaneda) today for initial psychosocial evaluation.  He is an 84 year old who had open heart surgery in 1983 and recently had a heart attack and stent inserted.  He has a strong support system with a spouse of 34 years and (3) adult children who live close by.  He is also actively involved in his local church.  Temple reports sleeping intermittently with ~6 hours overall sleep/night.  He has a good appetite.  Izael denies a history of depression or anxiety or any current symptoms and he is typically in a positive mood most of the time.  Sajad reports other than his health, he has minimal stress in his life.  He has goals to increase his stamina and strength and hopefully be more energetic  from consistently exercising in this program.  Staff will follow.      Expected Outcomes  Erez will benefit from consistent exercise to achieve his stated goals.  The educational and psychoeducational components of this program will be helpful as well in understanding and coping with his condition  more positively.      Continue Psychosocial Services   Follow up required by staff       Psychosocial Re-Evaluation: Psychosocial Re-Evaluation    Eldon Name 12/22/17 1052 01/06/18 0728 01/24/18 0904         Psychosocial Re-Evaluation   Current issues with  None Identified  Current Stress Concerns  Current Stress Concerns     Comments  Jared Castaneda is doing well in rehab.  He has a strong support system in his wife.  He tries not to let anything bother him too much.  He sleep between 6-8 hrs each night despite waking every couple of hours.  He has enjoyed coming out to exercise as his  made him more active and makes him feel better.   Taken to [facility] Emer[facility] for shortness of breath worse this past week and run of vtach per heart monitor in [place]. Jared Castaneda's wife was with him when I took him to the [employer].   Jersey continues to remain positive!  His wife is still his biggest supporter.  Their health states are their biggest stressors. She has cancer and he has his heart failure which makes him short of breath.  He continues to go to church and continues to sleep well.      Expected Outcomes  Short: continue to attend rehab to build strength and stamina.  Long: Continue to stay positve.   -  Short: Continue to rely on each other for support.  Long: Continue to go to [employer] and stay positive     Interventions  Encouraged to attend Cardiac Rehabilitation for the exercise;Stress management education  -  -     Continue Psychosocial Services   Follow up required by staff  -  -       Initial Review   Source of Stress Concerns  -  -  Chronic Illness        Psychosocial Discharge (Final Psychosocial Re-Evaluation): Psychosocial Re-Evaluation - 01/24/18 0904      Psychosocial Re-Evaluation   Current issues with  Current Stress Concerns    Comments  Jared Castaneda continues to remain positive!  His wife is still his biggest supporter.  Their health states are their biggest  stressors. She has cancer and he has his heart failure which makes him short of breath.  He continues to go to church and continues to sleep well.     Expected Outcomes  Short: Continue to rely on each other for support.  Long: Continue to go to [employer] and stay positive      Initial Review   Source of Stress Concerns  Chronic Illness       Vocational Rehabilitation: Provide vocational rehab assistance to qualifying candidates.   Vocational Rehab Evaluation & Intervention: Vocational Rehab - 12/08/17 1513      Initial Vocational Rehab Evaluation & Intervention   Assessment shows need for Vocational Rehabilitation  No       Education: Education Goals: Education classes will be provided on a variety of topics geared toward better understanding of heart health and risk factor modification. Participant will state understanding/return demonstration of topics presented as noted by education test scores.  Learning Barriers/Preferences: Learning Barriers/Preferences - 12/08/17 1512      Learning Barriers/Preferences   Learning Barriers  Exercise Concerns    Learning Preferences  None       Education Topics:  AED/CPR: - Group verbal and written instruction with the use of models to demonstrate the basic use of the AED with the basic ABC's of resuscitation.   Cardiac Rehab from 02/28/2018 in [facility] Cardiac and Pulmonary Rehab  Date  02/09/18  Educator  CE  Instruction Review Code  1- Verbalizes Understanding      General Nutrition Guidelines/Fats and Fiber: -Group instruction provided by verbal, written material, models and posters to present the general guidelines for heart healthy nutrition. Gives an explanation and review of dietary fats and fiber.   Controlling Sodium/Reading Food Labels: -Group verbal and written material supporting the discussion of sodium use in heart healthy nutrition. Review and explanation with models, verbal and written materials for utilization of the food  label.  Cardiac Rehab from 02/28/2018 in [facility] Cardiac and Pulmonary Rehab  Date  02/07/18  Educator  PI  Instruction Review Code  1- Verbalizes Understanding      Exercise Physiology & General Exercise Guidelines: - Group verbal and written instruction with models to review the exercise physiology of the cardiovascular system and associated critical values. Provides general exercise guidelines with specific guidelines to those with heart or lung disease.    Aerobic Exercise & Resistance Training: - Gives group verbal and written instruction on the various components of exercise. Focuses on aerobic and resistive training programs and the benefits of this training and how to safely progress through these programs..   Cardiac Rehab from 02/28/2018 in [facility] Cardiac and Pulmonary Rehab  Date  02/21/18  Educator  AS  Instruction Review Code  1- Verbalizes Understanding      Flexibility, Balance, Mind/Body Relaxation: Provides group verbal/written instruction on the benefits of flexibility and balance training, including mind/body exercise modes such as yoga, pilates and tai chi.  Demonstration and skill practice provided.   Cardiac Rehab from 02/28/2018 in [facility] Cardiac and Pulmonary Rehab  Date  02/28/18  Educator  AS  Instruction Review Code  1- Verbalizes Understanding      Stress and Anxiety: - Provides group verbal and written instruction about the health risks of elevated stress and causes of high stress.  Discuss the correlation between heart/lung disease and anxiety and treatment options. Review healthy ways to manage with stress and anxiety.   Depression: - Provides group verbal and written instruction on the correlation between heart/lung disease and depressed mood, treatment options, and the stigmas associated with seeking treatment.   Cardiac Rehab from 02/28/2018 in [facility] Cardiac and Pulmonary Rehab  Date  02/23/18  Educator  [facility]  Instruction Review Code  1- Verbalizes  Understanding      Anatomy & Physiology of the Heart: - Group verbal and written instruction and models provide basic cardiac anatomy and physiology, with the coronary electrical and arterial systems. Review of Valvular disease and Heart Failure   Cardiac Rehab from 02/28/2018 in [facility] Cardiac and Pulmonary Rehab  Date  01/26/18  Educator  CE  Instruction Review Code  1- Verbalizes Understanding      Cardiac Procedures: - Group verbal and written instruction to review commonly prescribed medications for heart disease. Reviews the medication, class of the drug, and side effects. Includes the steps to properly store meds and maintain the prescription regimen. (beta blockers and nitrates)   Cardiac Rehab from 02/28/2018 in [facility] Cardiac and Pulmonary Rehab  Date  02/02/18  Educator  CE  Instruction Review Code  1- Verbalizes Understanding      Cardiac Medications I: - Group verbal and written instruction to review commonly prescribed medications for heart disease. Reviews the medication, class of the drug, and side effects. Includes the steps to properly store meds and maintain the prescription regimen.   Cardiac Rehab from 02/28/2018 in [facility] Cardiac and Pulmonary Rehab  Date  01/17/18  Educator  [facility]  Instruction Review Code  1- Verbalizes Understanding      Cardiac Medications II: -Group verbal and written instruction to review commonly prescribed medications for heart disease. Reviews the medication, class of the drug, and side effects. (all other drug classes)    Go Sex-Intimacy & Heart Disease, Get SMART - Goal Setting: - Group verbal and written instruction through game format to discuss heart disease and the return to sexual intimacy. Provides group verbal and written material  to discuss and apply goal setting through the application of the S.M.A.R.T. Method.   Cardiac Rehab from 02/28/2018 in [facility] Cardiac and Pulmonary Rehab  Date  02/02/18  Educator  CE  Instruction Review Code   1- Verbalizes Understanding      Other Matters of the Heart: - Provides group verbal, written materials and models to describe Stable Angina and Peripheral Artery. Includes description of the disease process and treatment options available to the cardiac patient.   Exercise & Equipment Safety: - Individual verbal instruction and demonstration of equipment use and safety with use of the equipment.   Cardiac Rehab from 02/28/2018 in [facility] Cardiac and Pulmonary Rehab  Date  12/08/17  Educator  [facility]  Instruction Review Code  1- Verbalizes Understanding      Infection Prevention: - Provides verbal and written material to individual with discussion of infection control including proper hand washing and proper equipment cleaning during exercise session.   Cardiac Rehab from 02/28/2018 in [facility] Cardiac and Pulmonary Rehab  Date  12/08/17  Educator  [facility]  Instruction Review Code  1- Verbalizes Understanding      Falls Prevention: - Provides verbal and written material to individual with discussion of falls prevention and safety.   Cardiac Rehab from 02/28/2018 in [facility] Cardiac and Pulmonary Rehab  Date  12/08/17  Educator  [place]  Instruction Review Code  1- Verbalizes Understanding      Diabetes: - Individual verbal and written instruction to review signs/symptoms of diabetes, desired ranges of glucose level fasting, after meals and with exercise. Acknowledge that pre and post exercise glucose checks will be done for 3 sessions at entry of program.   Cardiac Rehab from 02/28/2018 in [facility] Cardiac and Pulmonary Rehab  Date  12/08/17  Educator  [facility]  Instruction Review Code  1- Verbalizes Understanding      Know Your Numbers and Risk Factors: -Group verbal and written instruction about important numbers in your health.  Discussion of what are risk factors and how they play a role in the disease process.  Review of Cholesterol, Blood Pressure, Diabetes, and BMI and the role they play in your overall  health.   Sleep Hygiene: -Provides group verbal and written instruction about how sleep can affect your health.  Define sleep hygiene, discuss sleep cycles and impact of sleep habits. Review good sleep hygiene tips.    Cardiac Rehab from 02/28/2018 in [facility] Cardiac and Pulmonary Rehab  Date  01/24/18  Educator  [facility]  Instruction Review Code  1- Verbalizes Understanding      Other: -Provides group and verbal instruction on various topics (see comments)   Knowledge Questionnaire Score: Knowledge Questionnaire Score - 02/21/18 1027      Knowledge Questionnaire Score   Pre Score  21/28    Post Score  21/28 reviewed results with pt       Core Components/Risk Factors/Patient Goals at Admission: Personal Goals and Risk Factors at Admission - 12/08/17 1501      Core Components/Risk Factors/Patient Goals on Admission    Weight Management  Yes;Weight Maintenance    Intervention  Weight Management: Develop a combined nutrition and exercise program designed to reach desired caloric intake, while maintaining appropriate intake of nutrient and fiber, sodium and fats, and appropriate energy expenditure required for the weight goal.;Weight Management: Provide education and appropriate resources to help participant work on and attain dietary goals.    Admit Weight  156 lb (70.8 kg)    Goal Weight: Long Term  156 lb (70.8 kg)    Expected Outcomes  Short Term: Continue to assess and modify interventions until short term weight is achieved;Long Term: Adherence to nutrition and physical activity/exercise program aimed toward attainment of established weight goal;Weight Maintenance: Understanding of the daily nutrition guidelines, which includes 25-35% calories from fat, 7% or less cal from saturated fats, less than '200mg'$  cholesterol, less than 1.5gm of sodium, & 5 or more servings of fruits and vegetables daily;Understanding recommendations for meals to include 15-35% energy as protein, 25-35% energy from  fat, 35-60% energy from carbohydrates, less than '200mg'$  of dietary cholesterol, 20-35 gm of total fiber daily;Understanding of distribution of calorie intake throughout the day with the consumption of 4-5 meals/snacks    Heart Failure  Yes    Intervention  Provide a combined exercise and nutrition program that is supplemented with education, support and counseling about heart failure. Directed toward relieving symptoms such as shortness of breath, decreased exercise tolerance, and extremity edema.    Expected Outcomes  Improve functional capacity of life;Short term: Attendance in program 2-3 days a week with increased exercise capacity. Reported lower sodium intake. Reported increased fruit and vegetable intake. Reports medication compliance.;Short term: Daily weights obtained and reported for increase. Utilizing diuretic protocols set by physician.;Long term: Adoption of self-care skills and reduction of barriers for early signs and symptoms recognition and intervention leading to self-care maintenance.    Hypertension  Yes    Intervention  Provide education on lifestyle modifcations including regular physical activity/exercise, weight management, moderate sodium restriction and increased consumption of fresh fruit, vegetables, and low fat dairy, alcohol moderation, and smoking cessation.;Monitor prescription use compliance.    Expected Outcomes  Short Term: Continued assessment and intervention until BP is < 140/92m HG in hypertensive participants. < 130/882mHG in hypertensive participants with diabetes, heart failure or chronic kidney disease.;Long Term: Maintenance of blood pressure at goal levels.    Lipids  Yes    Intervention  Provide education and support for participant on nutrition & aerobic/resistive exercise along with prescribed medications to achieve LDL '70mg'$ , HDL >'40mg'$ .    Expected Outcomes  Short Term: Participant states understanding of desired cholesterol values and is compliant with  medications prescribed. Participant is following exercise prescription and nutrition guidelines.;Long Term: Cholesterol controlled with medications as prescribed, with individualized exercise RX and with personalized nutrition plan. Value goals: LDL < '70mg'$ , HDL > 40 mg.       Core Components/Risk Factors/Patient Goals Review:  Goals and Risk Factor Review    Row Name 12/22/17 1039 01/24/18 0859 02/14/18 0935         Core Components/Risk Factors/Patient Goals Review   Personal Goals Review  Weight Management/Obesity;Heart Failure;Hypertension;Lipids  Weight Management/Obesity;Heart Failure;Hypertension;Lipids  Lipids;Hypertension;Improve shortness of breath with ADL's     Review  ClDiyanas been doing well in rehab.  His blood pressures have been good.  He (or his wife) checks them frequently at home. His weight has been steady and he checks it daily at home.  He has not had any heart failure symptoms.  He is doing well with his medications and had a medication reconcilliation over the phone yesterday.  ClKayvionontinues to do well in rehab.  His weight has been staying steady around 155 lbs here and between 149-153 at home.  He and his wife monitor his weight and blood pressure daily as part of his heart failure routine. Recently, he has not had any symptoms of heart failure.  He feels that his medications  are working well for him.   SOB has improved since starting HT.  Pt is taking all meds as directed.  Pt states he can be more active at home since starting to exercise      Expected Outcomes  Short: Continue to monitor heart failure symptoms daily.   Long: Continue to work on risk factors  Short: Continue to monitor weight daily to keep eye on heart failure.  Long: Continue to monitor risk factors.  Short - Jared Castaneda will finish HT program Long - Pt will maintian healthy lifetyle choices        Core Components/Risk Factors/Patient Goals at Discharge (Final Review):  Goals and Risk Factor Review  - 02/14/18 0935      Core Components/Risk Factors/Patient Goals Review   Personal Goals Review  Lipids;Hypertension;Improve shortness of breath with ADL's    Review  SOB has improved since starting HT.  Pt is taking all meds as directed.  Pt states he can be more active at home since starting to exercise     Expected Outcomes  Short - Jared Castaneda will finish HT program Long - Pt will maintian healthy lifetyle choices       ITP Comments: ITP Comments    Row Name 12/08/17 1437 01/03/18 1024 01/04/18 0637 01/05/18 0941 01/06/18 0727   ITP Comments  Med Review completed. Initial ITP created. Diagnosis can be found in Media Tab  Pt's wife called and left a message that Jared Castaneda was still not feeling well and would miss class again today.   30 day review completed. Continue with ITP unless diercted changes per Medical Director.   3 visit this month  Will fax to [place] 4 beat run of vtach and little more short of breath this week he said  Addendum-ended up taking Jared Castaneda to [place] via wheelchair on 01/05/2018 for vtach and shortness of breath. See Emerg Dept note:Plan: Patient had presented for dyspnea and PVCs seen in cardiac rehab. Patient's labs were within normal limits. 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  He has had occasional PVC here but no coupled beats or suggestions of V. tach.  These findings are chronic from the family's description.  He is cleared for outpatient follow-up with his cardiologist   Row Name 01/06/18 1514 02/01/18 0640 03/01/18 0600       ITP Comments  Jared Castaneda Lawana Pai from [facility] cardiology called to let us know that Jared Castaneda is cleared to resume rehab on Tuesday as he checked out fine in the clinic.  He was doing better today.  Jared Castaneda. Lawana Pai said to call him with any further needs and he can be reached at [phone] ext [phone]  30 Day review. Continue with ITP unless directed changes per Medical Director review.    30 day review.  Continue with ITP unless directed changes per Medical Director        Comments:

## 2018-03-02 ENCOUNTER — Encounter: Payer: Non-veteran care | Admitting: *Deleted

## 2018-03-02 DIAGNOSIS — I2510 Atherosclerotic heart disease of native coronary artery without angina pectoris: Secondary | ICD-10-CM | POA: Diagnosis not present

## 2018-03-02 DIAGNOSIS — Z955 Presence of coronary angioplasty implant and graft: Secondary | ICD-10-CM

## 2018-03-02 NOTE — Patient Instructions (Signed)
Discharge Patient Instructions  Patient Details  Name: Jared Castaneda MRN: [id number] Date of Birth: 1929-09-14 Referring Provider:  Pomeroy, [place] E, MD   Number of Visits: 36/36  Reason for Discharge:  Patient reached a stable level of exercise. Patient independent in their exercise. Patient has met program and personal goals.  Smoking History:  Social History   Tobacco Use  Smoking Status Former Smoker  . Last attempt to quit: 1984  . Years since quitting: 35.3  Smokeless Tobacco Never Used    Diagnosis:  Status post coronary artery stent placement  Initial Exercise Prescription: Initial Exercise Prescription - 12/08/17 1400      Date of Initial Exercise RX and Referring Provider   Date  12/08/17    Referring Provider  Pomeroy      Treadmill   MPH  1    Grade  0    Minutes  15      NuStep   Level  1    SPM  80    Minutes  15    METs  1.3      Biostep-RELP   Level  1    SPM  50    Minutes  15    METs  1.3      Track   Laps  11    Minutes  15    METs  1.5      Prescription Details   Frequency (times per week)  3    Duration  Progress to 30 minutes of continuous aerobic without signs/symptoms of physical distress      Intensity   THRR 40-80% of Max Heartrate  89-119    Ratings of Perceived Exertion  11-13    Perceived Dyspnea  0-4      Resistance Training   Training Prescription  Yes    Weight  2 lb    Reps  10-15       Discharge Exercise Prescription (Final Exercise Prescription Changes): Exercise Prescription Changes - 02/21/18 1600      Response to Exercise   Blood Pressure (Admit)  142/60    Blood Pressure (Exercise)  155/70    Blood Pressure (Exit)  126/70    Heart Rate (Admit)  70 bpm    Heart Rate (Exercise)  105 bpm    Heart Rate (Exit)  59 bpm    Rating of Perceived Exertion (Exercise)  12    Symptoms  none    Duration  Continue with 30 min of aerobic exercise without signs/symptoms of physical distress.    Intensity   THRR unchanged      Progression   Progression  Continue to progress workloads to maintain intensity without signs/symptoms of physical distress.    Average METs  3.06      Resistance Training   Training Prescription  Yes    Weight  3 lbs    Reps  10-15      Interval Training   Interval Training  No      NuStep   Level  6    Minutes  15    METs  3.8      Biostep-RELP   Level  4    Minutes  15    METs  2      Track   Laps  30    Minutes  15    METs  2.38      Home Exercise Plan   Plans to continue exercise at  Home (comment) walking  Frequency  Add 3 additional days to program exercise sessions.    Initial Home Exercises Provided  12/22/17       Functional Capacity: 6 Minute Walk    Row Name 12/08/17 1308 02/21/18 1025       6 Minute Walk   Phase  Initial  Discharge    Distance  742 feet  867 feet    Distance % Change  -  16.8 %    Distance Feet Change  -  125 ft    Walk Time  6 minutes  6 minutes    # of Rest Breaks  0  0    MPH  1.4  1.64    METS  1.36  1.85    RPE  13  12    Perceived Dyspnea   1  1    VO2 Peak  4.76  6.47    Symptoms  Yes (comment)  No    Comments  2/10 hip arthritis  used his cane  -    Resting HR  59 bpm  75 bpm    Resting BP  112/64  142/60    Resting Oxygen Saturation   98 % Room air  100 %    Exercise Oxygen Saturation  during 6 min walk  92 % Room air  100 %    Max Ex. HR  -  105 bpm    Max Ex. BP  136/56  154/70       Quality of Life: Quality of Life - 02/21/18 1030      Quality of Life Scores   Health/Function Pre  20.87 %    Health/Function Post  24.8 %    Health/Function % Change  18.83 %    Socioeconomic Pre  21 %    Socioeconomic Post  30 %    Socioeconomic % Change   42.86 %    Psych/Spiritual Pre  21 %    Psych/Spiritual Post  30 %    Psych/Spiritual % Change  42.86 %    Family Pre  21 %    Family Post  28.8 %    Family % Change  37.14 %    GLOBAL Pre  20.94 %    GLOBAL Post  27.45 %    GLOBAL %  Change  31.09 %       Personal Goals: Goals established at orientation with interventions provided to work toward goal. Personal Goals and Risk Factors at Admission - 12/08/17 1501      Core Components/Risk Factors/Patient Goals on Admission    Weight Management  Yes;Weight Maintenance    Intervention  Weight Management: Develop a combined nutrition and exercise program designed to reach desired caloric intake, while maintaining appropriate intake of nutrient and fiber, sodium and fats, and appropriate energy expenditure required for the weight goal.;Weight Management: Provide education and appropriate resources to help participant work on and attain dietary goals.    Admit Weight  156 lb (70.8 kg)    Goal Weight: Long Term  156 lb (70.8 kg)    Expected Outcomes  Short Term: Continue to assess and modify interventions until short term weight is achieved;Long Term: Adherence to nutrition and physical activity/exercise program aimed toward attainment of established weight goal;Weight Maintenance: Understanding of the daily nutrition guidelines, which includes 25-35% calories from fat, 7% or less cal from saturated fats, less than 249m cholesterol, less than 1.5gm of sodium, & 5 or more servings of fruits and vegetables daily;Understanding recommendations  for meals to include 15-35% energy as protein, 25-35% energy from fat, 35-60% energy from carbohydrates, less than 258m of dietary cholesterol, 20-35 gm of total fiber daily;Understanding of distribution of calorie intake throughout the day with the consumption of 4-5 meals/snacks    Heart Failure  Yes    Intervention  Provide a combined exercise and nutrition program that is supplemented with education, support and counseling about heart failure. Directed toward relieving symptoms such as shortness of breath, decreased exercise tolerance, and extremity edema.    Expected Outcomes  Improve functional capacity of life;Short term: Attendance in program  2-3 days a week with increased exercise capacity. Reported lower sodium intake. Reported increased fruit and vegetable intake. Reports medication compliance.;Short term: Daily weights obtained and reported for increase. Utilizing diuretic protocols set by physician.;Long term: Adoption of self-care skills and reduction of barriers for early signs and symptoms recognition and intervention leading to self-care maintenance.    Hypertension  Yes    Intervention  Provide education on lifestyle modifcations including regular physical activity/exercise, weight management, moderate sodium restriction and increased consumption of fresh fruit, vegetables, and low fat dairy, alcohol moderation, and smoking cessation.;Monitor prescription use compliance.    Expected Outcomes  Short Term: Continued assessment and intervention until BP is < 140/928mHG in hypertensive participants. < 130/8087mG in hypertensive participants with diabetes, heart failure or chronic kidney disease.;Long Term: Maintenance of blood pressure at goal levels.    Lipids  Yes    Intervention  Provide education and support for participant on nutrition & aerobic/resistive exercise along with prescribed medications to achieve LDL <47m80mDL >40mg62m Expected Outcomes  Short Term: Participant states understanding of desired cholesterol values and is compliant with medications prescribed. Participant is following exercise prescription and nutrition guidelines.;Long Term: Cholesterol controlled with medications as prescribed, with individualized exercise RX and with personalized nutrition plan. Value goals: LDL < 47mg,86m > 40 mg.        Personal Goals Discharge: Goals and Risk Factor Review - 02/14/18 0935      Core Components/Risk Factors/Patient Goals Review   Personal Goals Review  Lipids;Hypertension;Improve shortness of breath with ADL's    Review  SOB has improved since starting HT.  Pt is taking all meds as directed.  Pt states he can be  more active at home since starting to exercise     Expected Outcomes  Short - [facility] program Long - Pt will maintian healthy lifetyle choices       Exercise Goals and Review: Exercise Goals    Row Name 12/08/17 1436             Exercise Goals   Increase Physical Activity  Yes       Intervention  Provide advice, education, support and counseling about physical activity/exercise needs.;Develop an individualized exercise prescription for aerobic and resistive training based on initial evaluation findings, risk stratification, comorbidities and participant's personal goals.       Expected Outcomes  Short Term: Attend rehab on a regular basis to increase amount of physical activity.;Long Term: Add in home exercise to make exercise part of routine and to increase amount of physical activity.;Long Term: Exercising regularly at least 3-5 days a week.       Increase Strength and Stamina  Yes       Intervention  Provide advice, education, support and counseling about physical activity/exercise needs.;Develop an individualized exercise prescription for aerobic and resistive training based on initial evaluation  findings, risk stratification, comorbidities and participant's personal goals.       Expected Outcomes  Short Term: Increase workloads from initial exercise prescription for resistance, speed, and METs.;Short Term: Perform resistance training exercises routinely during rehab and add in resistance training at home;Long Term: Improve cardiorespiratory fitness, muscular endurance and strength as measured by increased METs and functional capacity (6MWT)       Able to understand and use rate of perceived exertion (RPE) scale  Yes       Intervention  Provide education and explanation on how to use RPE scale       Expected Outcomes  Short Term: Able to use RPE daily in rehab to express subjective intensity level;Long Term:  Able to use RPE to guide intensity level when exercising  independently       Able to understand and use Dyspnea scale  Yes       Intervention  Provide education and explanation on how to use Dyspnea scale       Expected Outcomes  Short Term: Able to use Dyspnea scale daily in rehab to express subjective sense of shortness of breath during exertion;Long Term: Able to use Dyspnea scale to guide intensity level when exercising independently       Knowledge and understanding of Target Heart Rate Range (THRR)  Yes       Intervention  Provide education and explanation of THRR including how the numbers were predicted and where they are located for reference       Expected Outcomes  Long Term: Able to use THRR to govern intensity when exercising independently;Short Term: Able to state/look up THRR;Short Term: Able to use daily as guideline for intensity in rehab       Able to check pulse independently  Yes       Intervention  Provide education and demonstration on how to check pulse in carotid and radial arteries.;Review the importance of being able to check your own pulse for safety during independent exercise       Expected Outcomes  Short Term: Able to explain why pulse checking is important during independent exercise;Long Term: Able to check pulse independently and accurately       Understanding of Exercise Prescription  Yes       Intervention  Provide education, explanation, and written materials on patient's individual exercise prescription       Expected Outcomes  Short Term: Able to explain program exercise prescription;Long Term: Able to explain home exercise prescription to exercise independently          Nutrition & Weight - Outcomes: Pre Biometrics - 12/08/17 1435      Pre Biometrics   Height  5' 9.5" (1.765 m)    Weight  156 lb (70.8 kg)    Waist Circumference  36.5 inches    Hip Circumference  39.5 inches    Waist to Hip Ratio  0.92 %    BMI (Calculated)  22.71      Post Biometrics - 02/21/18 1027       Post  Biometrics   Height  5'  9.5" (1.765 m)    Weight  155 lb (70.3 kg)    Waist Circumference  35 inches    Hip Circumference  38 inches    Waist to Hip Ratio  0.92 %    BMI (Calculated)  22.57       Nutrition: Nutrition Therapy & Goals - 12/22/17 1038      Nutrition Therapy  Diet  DASH    Drug/Food Interactions  Statins/Certain Fruits    Protein (specify units)  8    Fiber  30 grams    Whole Grain Foods  2 servings    Saturated Fats  14 max. grams    Fruits and Vegetables  5 servings/day    Sodium  1500 grams      Personal Nutrition Goals   Nutrition Goal  No more than 670m of sodium per meal / frozen meal    Personal Goal #2  Keep sodium below 200-3033mfor snacks    Personal Goal #3  Choose whole grain foods at least half the time, brown rice for example    Personal Goal #4  Continue to practice reading nutrition facts labels consistently to identify foods high in sodium and fat      Intervention Plan   Intervention  Prescribe, educate and counsel regarding individualized specific dietary modifications aiming towards targeted core components such as weight, hypertension, lipid management, diabetes, heart failure and other comorbidities.    Expected Outcomes  Short Term Goal: Understand basic principles of dietary content, such as calories, fat, sodium, cholesterol and nutrients.;Short Term Goal: A plan has been developed with personal nutrition goals set during dietitian appointment.;Long Term Goal: Adherence to prescribed nutrition plan.       Nutrition Discharge: Nutrition Assessments - 02/21/18 1029      MEDFICTS Scores   Pre Score  29    Post Score  12    Score Difference  -17       Education Questionnaire Score: Knowledge Questionnaire Score - 02/21/18 1027      Knowledge Questionnaire Score   Pre Score  21/28    Post Score  21/28 reviewed results with pt       Goals reviewed with patient; copy given to patient.

## 2018-03-02 NOTE — Progress Notes (Signed)
Daily Session Note  Patient Details  Name: Jared Castaneda MRN: [id number] Date of Birth: 1929-06-28 Referring Provider:     Cardiac Rehab from 12/08/2017 in [facility]  Referring Provider  Pomeroy      Encounter Date: 03/02/2018  Check In: Session Check In - 03/02/18 0829      Check-In   Location  [facility]    Staff Present  Alberteen Sam, MA, RCEP, CCRP, Exercise Physiologist;Amanda Oletta Darter, BA, ACSM CEP, Exercise Physiologist;Carroll Enterkin, RN, BSN    Supervising physician immediately available to respond to emergencies  See telemetry face sheet for immediately available ER MD    Medication changes reported      No    Fall or balance concerns reported     No    Warm-up and Cool-down  Performed on first and last piece of equipment    Resistance Training Performed  Yes    VAD Patient?  No      Pain Assessment   Currently in Pain?  No/denies          Social History   Tobacco Use  Smoking Status Former Smoker  . Last attempt to quit: 1984  . Years since quitting: 35.3  Smokeless Tobacco Never Used    Goals Met:  Independence with exercise equipment Exercise tolerated well No report of cardiac concerns or symptoms Strength training completed today  Goals Unmet:  Not Applicable  Comments: Pt able to follow exercise prescription today without complaint.  Will continue to monitor for progression.    Dr. Emily Filbert is Medical Director for [facility].

## 2018-03-07 ENCOUNTER — Encounter: Payer: Non-veteran care | Admitting: *Deleted

## 2018-03-07 DIAGNOSIS — Z955 Presence of coronary angioplasty implant and graft: Secondary | ICD-10-CM

## 2018-03-07 DIAGNOSIS — I2510 Atherosclerotic heart disease of native coronary artery without angina pectoris: Secondary | ICD-10-CM | POA: Diagnosis not present

## 2018-03-07 NOTE — Progress Notes (Signed)
Cardiac Individual Treatment Plan  Patient Details  Name: Bristol Soy MRN: [id number] Date of Birth: [birth year] Referring Provider:     Cardiac Rehab from 12/08/2017 in [facility] Cardiac and Pulmonary Rehab  Referring Provider  [place]      Initial Encounter Date:    Cardiac Rehab from 12/08/2017 in [facility] Cardiac and Pulmonary Rehab  Date  12/08/17  Referring Provider  Lawana Pai      Visit Diagnosis: Status post coronary artery stent placement  Patient's Home Medications on Admission:  Current Outpatient Medications:  .  acetaminophen (TYLENOL) 325 MG tablet, Take 650 mg by mouth every 6 (six) hours as needed., Disp: , Rfl:  .  apixaban (ELIQUIS) 5 MG TABS tablet, Take 5 mg by mouth 2 (two) times daily., Disp: , Rfl:  .  atorvastatin (LIPITOR) 80 MG tablet, Take 80 mg by mouth at bedtime., Disp: , Rfl:  .  carvedilol (COREG) 6.25 MG tablet, Take 6.25 mg by mouth 2 (two) times daily with a meal., Disp: , Rfl:  .  clopidogrel (PLAVIX) 75 MG tablet, Take 75 mg by mouth daily., Disp: , Rfl:  .  docusate sodium (COLACE) 100 MG capsule, Take 100 mg by mouth daily., Disp: , Rfl:  .  furosemide (LASIX) 20 MG tablet, Take 20 mg by mouth 2 (two) times daily as needed for fluid ("for Weigth Gain of more than 3 pounds or shortness of breath)., Disp: , Rfl:  .  magnesium oxide (MAG-OX) 400 MG tablet, Take 400 mg by mouth daily., Disp: , Rfl:  .  nitroGLYCERIN (NITROSTAT) 0.4 MG SL tablet, Place 0.4 mg under the tongue every 5 (five) minutes as needed for chest pain., Disp: , Rfl:   Past Medical History: Past Medical History:  Diagnosis Date  . A-fib ([place])   . CLL (chronic lymphocytic leukemia) ([place])   . Coronary artery disease   . Diabetes mellitus without complication ([place])   . Hypertension   . Thyroid disease     Tobacco Use: Social History   Tobacco Use  Smoking Status Former Smoker  . Last attempt to quit: 1984  . Years since quitting: 35.3  Smokeless Tobacco Never Used     Labs: Recent Review Flowsheet Data    There is no flowsheet data to display.       Exercise Target Goals:    Exercise Program Goal: Individual exercise prescription set using results from initial 6 min walk test and THRR while considering  patient's activity barriers and safety.   Exercise Prescription Goal: Initial exercise prescription builds to 30-45 minutes a day of aerobic activity, 2-3 days per week.  Home exercise guidelines will be given to patient during program as part of exercise prescription that the participant will acknowledge.  Activity Barriers & Risk Stratification: Activity Barriers & Cardiac Risk Stratification - 12/08/17 1514      Activity Barriers & Cardiac Risk Stratification   Activity Barriers  Arthritis;Shortness of Breath    Cardiac Risk Stratification  High       6 Minute Walk: 6 Minute Walk    Row Name 12/08/17 1308 02/21/18 1025       6 Minute Walk   Phase  Initial  Discharge    Distance  742 feet  867 feet    Distance % Change  -  16.8 %    Distance Feet Change  -  125 ft    Walk Time  6 minutes  6 minutes    # of Rest Breaks  0  0    MPH  1.4  1.64    METS  1.36  1.85    RPE  13  12    Perceived Dyspnea   1  1    VO2 Peak  4.76  6.47    Symptoms  Yes (comment)  No    Comments  2/10 hip arthritis  used his cane  -    Resting HR  59 bpm  75 bpm    Resting BP  112/64  142/60    Resting Oxygen Saturation   98 % Room air  100 %    Exercise Oxygen Saturation  during 6 min walk  92 % Room air  100 %    Max Ex. HR  -  105 bpm    Max Ex. BP  136/56  154/70       Oxygen Initial Assessment:   Oxygen Re-Evaluation:   Oxygen Discharge (Final Oxygen Re-Evaluation):   Initial Exercise Prescription: Initial Exercise Prescription - 12/08/17 1400      Date of Initial Exercise RX and Referring Provider   Date  12/08/17    Referring Provider  P[place]oy      Treadmill   MPH  1    Grade  0    Minutes  15      NuStep   Level  1     SPM  80    Minutes  15    METs  1.3      Biostep-RELP   Level  1    SPM  50    Minutes  15    METs  1.3      Track   Laps  11    Minutes  15    METs  1.5      Prescription Details   Frequency (times per week)  3    Duration  Progress to 30 minutes of continuous aerobic without signs/symptoms of physical distress      Intensity   THRR 40-80% of Max Heartrate  89-119    Ratings of Perceived Exertion  11-13    Perceived Dyspnea  0-4      Resistance Training   Training Prescription  Yes    Weight  2 lb    Reps  10-15       Perform Capillary Blood Glucose checks as needed.  Exercise Prescription Changes: Exercise Prescription Changes    Row Name 12/08/17 1500 12/22/17 1000 12/27/17 1600 01/11/18 0800 01/24/18 1500     Response to Exercise   Blood Pressure (Admit)  112/64  -  126/64  136/60  136/72   Blood Pressure (Exercise)  136/56  -  138/68  122/60  134/60   Blood Pressure (Exit)  -  -  122/54  128/60  114/60   Heart Rate (Admit)  71 bpm  -  86 bpm  68 bpm  71 bpm   Heart Rate (Exercise)  93 bpm  -  95 bpm  102 bpm  98 bpm   Heart Rate (Exit)  69 bpm  -  68 bpm  72 bpm  65 bpm   Oxygen Saturation (Admit)  98 %  -  -  -  -   Oxygen Saturation (Exercise)  92 %  -  -  -  -   Rating of Perceived Exertion (Exercise)  13  -  '12  11  10   '$ Symptoms  -  -  none  none  none  Comments  -  -  third full day of exercise  -  -   Duration  -  -  Continue with 30 min of aerobic exercise without signs/symptoms of physical distress.  Continue with 30 min of aerobic exercise without signs/symptoms of physical distress.  Continue with 30 min of aerobic exercise without signs/symptoms of physical distress.   Intensity  -  -  THRR unchanged  THRR unchanged  THRR unchanged     Progression   Progression  -  -  Continue to progress workloads to maintain intensity without signs/symptoms of physical distress.  Continue to progress workloads to maintain intensity without signs/symptoms of  physical distress.  Continue to progress workloads to maintain intensity without signs/symptoms of physical distress.   Average METs  -  -  2.17  2.15  2.25     Resistance Training   Training Prescription  -  -  Yes  Yes  Yes   Weight  -  -  2 lb  2 lbs  2 lbs   Reps  -  -  10-15  10-15  10-15     Interval Training   Interval Training  -  -  No  No  No     NuStep   Level  -  -  '3  3  3   '$ Minutes  -  -  '15  15  15   '$ METs  -  -  2.4  2.3  2.5     Biostep-RELP   Level  -  -  '1  3  3   '$ Minutes  -  -  '15  15  15   '$ METs  -  -  '2  2  2     '$ Track   Laps  -  -  24  -  -   Minutes  -  -  15  -  -   METs  -  -  2.11  -  -     Home Exercise Plan   Plans to continue exercise at  -  Home (comment) walking  Home (comment) walking  Home (comment) walking  Home (comment) walking   Frequency  -  Add 3 additional days to program exercise sessions.  Add 3 additional days to program exercise sessions.  Add 3 additional days to program exercise sessions.  Add 3 additional days to program exercise sessions.   Initial Home Exercises Provided  -  12/22/17  12/22/17  12/22/17  12/22/17   Row Name 02/08/18 1400 02/21/18 1600           Response to Exercise   Blood Pressure (Admit)  126/68  142/60      Blood Pressure (Exercise)  134/70  155/70      Blood Pressure (Exit)  108/56  126/70      Heart Rate (Admit)  67 bpm  70 bpm      Heart Rate (Exercise)  94 bpm  105 bpm      Heart Rate (Exit)  66 bpm  59 bpm      Rating of Perceived Exertion (Exercise)  10  12      Symptoms  none  none      Duration  Continue with 30 min of aerobic exercise without signs/symptoms of physical distress.  Continue with 30 min of aerobic exercise without signs/symptoms of physical distress.      Intensity  THRR unchanged  THRR unchanged  Progression   Progression  Continue to progress workloads to maintain intensity without signs/symptoms of physical distress.  Continue to progress workloads to maintain intensity  without signs/symptoms of physical distress.      Average METs  2.29  3.06        Resistance Training   Training Prescription  Yes  Yes      Weight  2 lbs  3 lbs      Reps  10-15  10-15        Interval Training   Interval Training  No  No        NuStep   Level  3  6      Minutes  15  15      METs  2.5  3.8        Biostep-RELP   Level  3  4      Minutes  15  15      METs  2  2        Track   Laps  30  30      Minutes  15  15      METs  2.38  2.38        Home Exercise Plan   Plans to continue exercise at  Home (comment) walking  Home (comment) walking      Frequency  Add 3 additional days to program exercise sessions.  Add 3 additional days to program exercise sessions.      Initial Home Exercises Provided  12/22/17  12/22/17         Exercise Comments: Exercise Comments    Row Name 12/15/17 1053 12/22/17 0945 03/07/18 0913       Exercise Comments  First full day of exercise!  Patient was oriented to gym and equipment including functions, settings, policies, and procedures.  Patient's individual exercise prescription and treatment plan were reviewed.  All starting workloads were established based on the results of the 6 minute walk test done at initial orientation visit.  The plan for exercise progression was also introduced and progression will be customized based on patient's performance and goals.  Reviewed home exercise with pt today.  Pt plans to walk and do stretches for exercise.  Reviewed THR, pulse, RPE, sign and symptoms, NTG use, and when to call 911 or MD.  Also discussed weather considerations and indoor options.  Pt voiced understanding.   Larue graduated today from  rehab with 36 sessions completed.  Details of the patient's exercise prescription and what He needs to do in order to continue the prescription and progress were discussed with patient.  Patient was given a copy of prescription and goals.  Patient verbalized understanding.  Eder plans to continue  to exercise by walking and joining the [place].        Exercise Goals and Review: Exercise Goals    Row Name 12/08/17 1436             Exercise Goals   Increase Physical Activity  Yes       Intervention  Provide advice, education, support and counseling about physical activity/exercise needs.;Develop an individualized exercise prescription for aerobic and resistive training based on initial evaluation findings, risk stratification, comorbidities and participant's personal goals.       Expected Outcomes  Short Term: Attend rehab on a regular basis to increase amount of physical activity.;Long Term: Add in home exercise to make exercise part of routine and to increase amount of physical activity.;Long Term:  Exercising regularly at least 3-5 days a week.       Increase Strength and Stamina  Yes       Intervention  Provide advice, education, support and counseling about physical activity/exercise needs.;Develop an individualized exercise prescription for aerobic and resistive training based on initial evaluation findings, risk stratification, comorbidities and participant's personal goals.       Expected Outcomes  Short Term: Increase workloads from initial exercise prescription for resistance, speed, and METs.;Short Term: Perform resistance training exercises routinely during rehab and add in resistance training at home;Long Term: Improve cardiorespiratory fitness, muscular endurance and strength as measured by increased METs and functional capacity (6MWT)       Able to understand and use rate of perceived exertion (RPE) scale  Yes       Intervention  Provide education and explanation on how to use RPE scale       Expected Outcomes  Short Term: Able to use RPE daily in rehab to express subjective intensity level;Long Term:  Able to use RPE to guide intensity level when exercising independently       Able to understand and use Dyspnea scale  Yes       Intervention  Provide education and  explanation on how to use Dyspnea scale       Expected Outcomes  Short Term: Able to use Dyspnea scale daily in rehab to express subjective sense of shortness of breath during exertion;Long Term: Able to use Dyspnea scale to guide intensity level when exercising independently       Knowledge and understanding of Target Heart Rate Range (THRR)  Yes       Intervention  Provide education and explanation of THRR including how the numbers were predicted and where they are located for reference       Expected Outcomes  Long Term: Able to use THRR to govern intensity when exercising independently;Short Term: Able to state/look up THRR;Short Term: Able to use daily as guideline for intensity in rehab       Able to check pulse independently  Yes       Intervention  Provide education and demonstration on how to check pulse in carotid and radial arteries.;Review the importance of being able to check your own pulse for safety during independent exercise       Expected Outcomes  Short Term: Able to explain why pulse checking is important during independent exercise;Long Term: Able to check pulse independently and accurately       Understanding of Exercise Prescription  Yes       Intervention  Provide education, explanation, and written materials on patient's individual exercise prescription       Expected Outcomes  Short Term: Able to explain program exercise prescription;Long Term: Able to explain home exercise prescription to exercise independently          Exercise Goals Re-Evaluation : Exercise Goals Re-Evaluation    Row Name 12/15/17 1053 12/22/17 0945 01/11/18 0835 01/24/18 0854 02/08/18 1449     Exercise Goal Re-Evaluation   Exercise Goals Review  Increase Physical Activity;Increase Strength and Stamina;Understanding of Exercise Prescription;Knowledge and understanding of Target Heart Rate Range (THRR);Able to understand and use rate of perceived exertion (RPE) scale  Increase Physical Activity;Increase  Strength and Stamina;Able to understand and use rate of perceived exertion (RPE) scale;Knowledge and understanding of Target Heart Rate Range (THRR);Able to check pulse independently;Understanding of Exercise Prescription  Increase Physical Activity;Understanding of Exercise Prescription;Increase Strength and Stamina  Increase Physical Activity;Understanding  of Exercise Prescription;Increase Strength and Stamina  Increase Physical Activity;Understanding of Exercise Prescription;Increase Strength and Stamina   Comments  Reviewed RPE scale, THR and program prescription with pt today.  Pt voiced understanding and was given a copy of goals to take home  Sade has been doing well in rehab.  He is able to do more at home as he recovers his strength and stamina.  Reviewed home exercise with pt today.  Pt plans to walk and do stretches for exercise.  Reviewed THR, pulse, RPE, sign and symptoms, NTG use, and when to call 911 or MD.  Also discussed weather considerations and indoor options.  Pt voiced understanding.  Osbaldo has been doing well in rehab.  He did have a 5 beat run of vtach walking last week, but his doctor would like to see him in class and exercising regularly.  He is up to level 3 on the BioStep.  We will continue to monitor his progression and his rhythm  Sahaj has been doing well in rehab.  He is getting more active and walking every day.  He and his wife have been walking out in the yard and going to the track for about an hour each day.  He still takes some rest breaks, but is going further than ever before.  He feels better overall.   Treylan continues to do well in rehab.  He continues to walk daily with his wife.  He is back up to 30 laps in rehab again.  We will continue to monitor his progress.    Expected Outcomes  Short: Use RPE daily to regulate intensity.  Long: Follow program prescription in THR.  Short - Pt will continue exercise at home most days and monitor pulse and RPE Long - Pt  will exercise on his own  Short: Continue to encourage him to exercise at home.  Long: Continue to increase activity levels.   Short: Continue to exercise daily at home.  Long: Continue to try to increase workloads.   Short: Try level 4 on BioStep.  Long: Continue to walk daily to build strength and [place] Name 02/21/18 1632             Exercise Goal Re-Evaluation   Exercise Goals Review  Increase Physical Activity;Understanding of Exercise Prescription;Increase Strength and Stamina       Comments  Darryll is nearing graduation!  He improved 16.8%!!  He is doing level 4 on the BioStep.  He is planning to continue to exercise by walking at home.  We will continue to monitor his progress.        Expected Outcomes  Short: Graduate!!!  Long: Continue to walk with wife daily!!          Discharge Exercise Prescription (Final Exercise Prescription Changes): Exercise Prescription Changes - 02/21/18 1600      Response to Exercise   Blood Pressure (Admit)  142/60    Blood Pressure (Exercise)  155/70    Blood Pressure (Exit)  126/70    Heart Rate (Admit)  70 bpm    Heart Rate (Exercise)  105 bpm    Heart Rate (Exit)  59 bpm    Rating of Perceived Exertion (Exercise)  12    Symptoms  none    Duration  Continue with 30 min of aerobic exercise without signs/symptoms of physical distress.    Intensity  THRR unchanged      Progression   Progression  Continue to progress workloads to  maintain intensity without signs/symptoms of physical distress.    Average METs  3.06      Resistance Training   Training Prescription  Yes    Weight  3 lbs    Reps  10-15      Interval Training   Interval Training  No      NuStep   Level  6    Minutes  15    METs  3.8      Biostep-RELP   Level  4    Minutes  15    METs  2      Track   Laps  30    Minutes  15    METs  2.38      Home Exercise Plan   Plans to continue exercise at  Home (comment) walking    Frequency  Add 3 additional days  to program exercise sessions.    Initial Home Exercises Provided  12/22/17       Nutrition:  Target Goals: Understanding of nutrition guidelines, daily intake of sodium '1500mg'$ , cholesterol '200mg'$ , calories 30% from fat and 7% or less from saturated fats, daily to have 5 or more servings of fruits and vegetables.  Biometrics: Pre Biometrics - 12/08/17 1435      Pre Biometrics   Height  5' 9.5" (1.765 m)    Weight  156 lb (70.8 kg)    Waist Circumference  36.5 inches    Hip Circumference  39.5 inches    Waist to Hip Ratio  0.92 %    BMI (Calculated)  22.71      Post Biometrics - 02/21/18 1027       Post  Biometrics   Height  5' 9.5" (1.765 m)    Weight  155 lb (70.3 kg)    Waist Circumference  35 inches    Hip Circumference  38 inches    Waist to Hip Ratio  0.92 %    BMI (Calculated)  22.57       Nutrition Therapy Plan and Nutrition Goals: Nutrition Therapy & Goals - 12/22/17 1038      Nutrition Therapy   Diet  DASH    Drug/Food Interactions  Statins/Certain Fruits    Protein (specify units)  8    Fiber  30 grams    Whole Grain Foods  2 servings    Saturated Fats  14 max. grams    Fruits and Vegetables  5 servings/day    Sodium  1500 grams      Personal Nutrition Goals   Nutrition Goal  No more than '600mg'$  of sodium per meal / frozen meal    Personal Goal #2  Keep sodium below 200-'300mg'$  for snacks    Personal Goal #3  Choose whole grain foods at least half the time, brown rice for example    Personal Goal #4  Continue to practice reading nutrition facts labels consistently to identify foods high in sodium and fat      Intervention Plan   Intervention  Prescribe, educate and counsel regarding individualized specific dietary modifications aiming towards targeted core components such as weight, hypertension, lipid management, diabetes, heart failure and other comorbidities.    Expected Outcomes  Short Term Goal: Understand basic principles of dietary content, such as  calories, fat, sodium, cholesterol and nutrients.;Short Term Goal: A plan has been developed with personal nutrition goals set during dietitian appointment.;Long Term Goal: Adherence to prescribed nutrition plan.       Nutrition Assessments: Nutrition  Assessments - 02/21/18 1029      MEDFICTS Scores   Pre Score  29    Post Score  12    Score Difference  -17       Nutrition Goals Re-Evaluation: Nutrition Goals Re-Evaluation    [place] Name 01/24/18 0902 02/14/18 0931           Goals   Nutrition Goal  No more than '600mg'$  of sodium per meal / frozen meal, 200-300 mg snacks, whole grains, reading labels  -      Comment  Dominyk and his wife have been reading food labels more than ever.  They are closely monitoring his salt intake and have really cut back across the boads.  They are also trying to get in more whole grains and cutting back on sweets.  They said they learned a lot at thier nutrition appointment.   Spencer states he is eating more whole grains and continues to work on reading labels to reduce sodium.  He has cut down on sweets.      Expected Outcome  Short: Continue to try to reduce sodium intake.  Long: Continue to work on Psychologist, clinical for heart healthy diet.   Short - Zekiel will check labels for sodium and continue whole grains  Long - Mayo will stick to low sodium and include whole grains         Nutrition Goals Discharge (Final Nutrition Goals Re-Evaluation): Nutrition Goals Re-Evaluation - 02/14/18 0931      Goals   Comment  Tamarick states he is eating more whole grains and continues to work on reading labels to reduce sodium.  He has cut down on sweets.    Expected Outcome  Short - [place] will check labels for sodium and continue whole grains  Long - Isak will stick to low sodium and include whole grains       Psychosocial: Target Goals: Acknowledge presence or absence of significant depression and/or stress, maximize coping skills, provide positive  support system. Participant is able to verbalize types and ability to use techniques and skills needed for reducing stress and depression.   Initial Review & Psychosocial Screening: Initial Psych Review & Screening - 12/08/17 1508      Initial Review   Current issues with  Current Sleep Concerns wakes up around 2-3 am most mornings and struggles going back to sleep. 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  Yes spouse, children, church family      Screening Interventions   Interventions  Encouraged to exercise;Program counselor consult;To provide support and resources with identified psychosocial needs;Provide feedback about the scores to participant    Expected Outcomes  Short Term goal: Utilizing psychosocial counselor, staff and physician to assist with identification of specific Stressors or current issues interfering with healing process. Setting desired goal for each stressor or current issue identified.;Long Term Goal: Stressors or current issues are controlled or eliminated.;Short Term goal: Identification and review with participant of any Quality of Life or Depression concerns found by scoring the questionnaire.;Long Term goal: The participant improves quality of Life and PHQ9 Scores as seen by post scores and/or verbalization of changes       Quality of Life Scores:  Quality of Life - 02/21/18 1030      Quality of Life Scores   Health/Function Pre  20.87 %    Health/Function Post  24.8 %    Health/Function %  Change  18.83 %    Socioeconomic Pre  21 %    Socioeconomic Post  30 %    Socioeconomic % Change   42.86 %    Psych/Spiritual Pre  21 %    Psych/Spiritual Post  30 %    Psych/Spiritual % Change  42.86 %    Family Pre  21 %    Family Post  28.8 %    Family % Change  37.14 %    GLOBAL Pre  20.94 %    GLOBAL Post  27.45 %    GLOBAL % Change  31.09 %      Scores of 19 and below usually indicate a poorer  quality of life in these areas.  A difference of  2-3 points is a clinically meaningful difference.  A difference of 2-3 points in the total score of the Quality of Life Index has been associated with significant improvement in overall quality of life, self-image, physical symptoms, and general health in studies assessing change in quality of life.  PHQ-9: Recent Review Flowsheet Data    Depression screen [facility] 2/9 02/21/2018 12/08/2017   Decreased Interest 3 0   Down, Depressed, Hopeless 0 0   PHQ - 2 Score 3 0   Altered sleeping 0 3   Tired, decreased energy 2 1   Change in appetite 0 0   Feeling bad or failure about yourself  0 0   Trouble concentrating 0 0   Moving slowly or fidgety/restless 0 0   Suicidal thoughts 0 0   PHQ-9 Score 5 4   Difficult doing work/chores Not difficult at all Not difficult at all     Interpretation of Total Score  Total Score Depression Severity:  1-4 = Minimal depression, 5-9 = Mild depression, 10-14 = Moderate depression, 15-19 = Moderately severe depression, 20-27 = Severe depression   Psychosocial Evaluation and Intervention: Psychosocial Evaluation - 01/05/18 1031      Psychosocial Evaluation & Interventions   Interventions  Encouraged to exercise with the program and follow exercise prescription    Comments  Counselor met with Mr. Fout Lattanzio) today for initial psychosocial evaluation.  He is an 84 year old who had open heart surgery in 1983 and recently had a heart attack and stent inserted.  He has a strong support system with a spouse of 61 years and (3) adult children who live close by.  He is also actively involved in his local church.  Mickell reports sleeping intermittently with ~6 hours overall sleep/night.  He has a good appetite.  Kreig denies a history of depression or anxiety or any current symptoms and he is typically in a positive mood most of the time.  Pavle reports other than his health, he has minimal stress in his life.  He  has goals to increase his stamina and strength and hopefully be more energetic  from consistently exercising in this program.  Staff will follow.      Expected Outcomes  Lige will benefit from consistent exercise to achieve his stated goals.  The educational and psychoeducational components of this program will be helpful as well in understanding and coping with his condition more positively.      Continue Psychosocial Services   Follow up required by staff       Psychosocial Re-Evaluation: Psychosocial Re-Evaluation    [place] Name 12/22/17 1052 01/06/18 0728 01/24/18 0904         Psychosocial Re-Evaluation   Current issues with  None Identified  Current Stress Concerns  Current Stress Concerns     Comments  Crayton is doing well in rehab.  He has a strong support system in his wife.  He tries not to let anything bother him too much.  He sleep between 6-8 hrs each night despite waking every couple of hours.  He has enjoyed coming out to exercise as his made him more active and makes him feel better.   Taken to [facility] for shortness of breath worse this past week and run of vtach per heart monitor in [place]. Aengus's wife was with him when I took him to the [employer].   Abran continues to remain positive!  His wife is still his biggest supporter.  Their health states are their biggest stressors. She has cancer and he has his heart failure which makes him short of breath.  He continues to go to church and continues to sleep well.      Expected Outcomes  Short: continue to attend rehab to build strength and stamina.  Long: Continue to stay positve.   -  Short: Continue to rely on each other for support.  Long: Continue to go to [employer] and stay positive     Interventions  Encouraged to attend Cardiac Rehabilitation for the exercise;Stress management education  -  -     Continue Psychosocial Services   Follow up required by staff  -  -       Initial Review    Source of Stress Concerns  -  -  Chronic Illness        Psychosocial Discharge (Final Psychosocial Re-Evaluation): Psychosocial Re-Evaluation - 01/24/18 0904      Psychosocial Re-Evaluation   Current issues with  Current Stress Concerns    Comments  Rochell continues to remain positive!  His wife is still his biggest supporter.  Their health states are their biggest stressors. She has cancer and he has his heart failure which makes him short of breath.  He continues to go to church and continues to sleep well.     Expected Outcomes  Short: Continue to rely on each other for support.  Long: Continue to go to [employer] and stay positive      Initial Review   Source of Stress Concerns  Chronic Illness       Vocational Rehabilitation: Provide vocational rehab assistance to qualifying candidates.   Vocational Rehab Evaluation & Intervention: Vocational Rehab - 12/08/17 1513      Initial Vocational Rehab Evaluation & Intervention   Assessment shows need for Vocational Rehabilitation  No       Education: Education Goals: Education classes will be provided on a variety of topics geared toward better understanding of heart health and risk factor modification. Participant will state understanding/return demonstration of topics presented as noted by education test scores.  Learning Barriers/Preferences: Learning Barriers/Preferences - 12/08/17 1512      Learning Barriers/Preferences   Learning Barriers  Exercise Concerns    Learning Preferences  None       Education Topics:  AED/CPR: - Group verbal and written instruction with the use of models to demonstrate the basic use of the AED with the basic ABC's of resuscitation.   Cardiac Rehab from 03/07/2018 in [facility] Cardiac and Pulmonary Rehab  Date  02/09/18  Educator  CE  Instruction Review Code  1- Verbalizes Understanding      General Nutrition Guidelines/Fats and Fiber: -Group  instruction provided by verbal, written material,  models and posters to present the general guidelines for heart healthy nutrition. Gives an explanation and review of dietary fats and fiber.   Controlling Sodium/Reading Food Labels: -Group verbal and written material supporting the discussion of sodium use in heart healthy nutrition. Review and explanation with models, verbal and written materials for utilization of the food label.   Cardiac Rehab from 03/07/2018 in [facility] Cardiac and Pulmonary Rehab  Date  02/07/18  Educator  PI  Instruction Review Code  1- Verbalizes Understanding      Exercise Physiology & General Exercise Guidelines: - Group verbal and written instruction with models to review the exercise physiology of the cardiovascular system and associated critical values. Provides general exercise guidelines with specific guidelines to those with heart or lung disease.    Aerobic Exercise & Resistance Training: - Gives group verbal and written instruction on the various components of exercise. Focuses on aerobic and resistive training programs and the benefits of this training and how to safely progress through these programs..   Cardiac Rehab from 03/07/2018 in [facility] Cardiac and Pulmonary Rehab  Date  02/21/18  Educator  AS  Instruction Review Code  1- Verbalizes Understanding      Flexibility, Balance, Mind/Body Relaxation: Provides group verbal/written instruction on the benefits of flexibility and balance training, including mind/body exercise modes such as yoga, pilates and tai chi.  Demonstration and skill practice provided.   Cardiac Rehab from 03/07/2018 in [facility] Cardiac and Pulmonary Rehab  Date  02/28/18  Educator  AS  Instruction Review Code  1- Verbalizes Understanding      Stress and Anxiety: - Provides group verbal and written instruction about the health risks of elevated stress and causes of high stress.  Discuss the correlation between heart/lung disease and anxiety and treatment options. Review healthy ways to  manage with stress and anxiety.   Cardiac Rehab from 03/07/2018 in [facility] Cardiac and Pulmonary Rehab  Date  03/07/18  Educator  [facility]  Instruction Review Code  1- Verbalizes Understanding      Depression: - Provides group verbal and written instruction on the correlation between heart/lung disease and depressed mood, treatment options, and the stigmas associated with seeking treatment.   Cardiac Rehab from 03/07/2018 in [facility] Cardiac and Pulmonary Rehab  Date  02/23/18  Educator  [facility]  Instruction Review Code  1- Verbalizes Understanding      Anatomy & Physiology of the Heart: - Group verbal and written instruction and models provide basic cardiac anatomy and physiology, with the coronary electrical and arterial systems. Review of Valvular disease and Heart Failure   Cardiac Rehab from 03/07/2018 in [facility] Cardiac and Pulmonary Rehab  Date  01/26/18  Educator  CE  Instruction Review Code  1- Verbalizes Understanding      Cardiac Procedures: - Group verbal and written instruction to review commonly prescribed medications for heart disease. Reviews the medication, class of the drug, and side effects. Includes the steps to properly store meds and maintain the prescription regimen. (beta blockers and nitrates)   Cardiac Rehab from 03/07/2018 in [facility] Cardiac and Pulmonary Rehab  Date  02/02/18  Educator  CE  Instruction Review Code  1- Verbalizes Understanding      Cardiac Medications I: - Group verbal and written instruction to review commonly prescribed medications for heart disease. Reviews the medication, class of the drug, and side effects. Includes the steps to properly store meds and maintain the prescription regimen.   Cardiac  Rehab from 03/07/2018 in [facility] Cardiac and Pulmonary Rehab  Date  01/17/18  Educator  SB  Instruction Review Code  1- Verbalizes Understanding      Cardiac Medications II: -Group verbal and written instruction to review commonly prescribed medications for heart  disease. Reviews the medication, class of the drug, and side effects. (all other drug classes)   Cardiac Rehab from 03/07/2018 in [facility] Cardiac and Pulmonary Rehab  Date  03/02/18  Educator  CE  Instruction Review Code  1- Verbalizes Understanding       Go Sex-Intimacy & Heart Disease, Get SMART - Goal Setting: - Group verbal and written instruction through game format to discuss heart disease and the return to sexual intimacy. Provides group verbal and written material to discuss and apply goal setting through the application of the S.M.A.R.T. Method.   Cardiac Rehab from 03/07/2018 in [facility] Cardiac and Pulmonary Rehab  Date  02/02/18  Educator  CE  Instruction Review Code  1- Verbalizes Understanding      Other Matters of the Heart: - Provides group verbal, written materials and models to describe Stable Angina and Peripheral Artery. Includes description of the disease process and treatment options available to the cardiac patient.   Exercise & Equipment Safety: - Individual verbal instruction and demonstration of equipment use and safety with use of the equipment.   Cardiac Rehab from 03/07/2018 in [facility] Cardiac and Pulmonary Rehab  Date  12/08/17  Educator  [facility]  Instruction Review Code  1- Verbalizes Understanding      Infection Prevention: - Provides verbal and written material to individual with discussion of infection control including proper hand washing and proper equipment cleaning during exercise session.   Cardiac Rehab from 03/07/2018 in [facility] Cardiac and Pulmonary Rehab  Date  12/08/17  Educator  [facility]  Instruction Review Code  1- Verbalizes Understanding      Falls Prevention: - Provides verbal and written material to individual with discussion of falls prevention and safety.   Cardiac Rehab from 03/07/2018 in [facility] Cardiac and Pulmonary Rehab  Date  12/08/17  Educator  [facility]  Instruction Review Code  1- Verbalizes Understanding      Diabetes: - Individual verbal and  written instruction to review signs/symptoms of diabetes, desired ranges of glucose level fasting, after meals and with exercise. Acknowledge that pre and post exercise glucose checks will be done for 3 sessions at entry of program.   Cardiac Rehab from 03/07/2018 in [facility] Cardiac and Pulmonary Rehab  Date  12/08/17  Educator  [facility]  Instruction Review Code  1- Verbalizes Understanding      Know Your Numbers and Risk Factors: -Group verbal and written instruction about important numbers in your health.  Discussion of what are risk factors and how they play a role in the disease process.  Review of Cholesterol, Blood Pressure, Diabetes, and BMI and the role they play in your overall health.   Cardiac Rehab from 03/07/2018 in [facility] Cardiac and Pulmonary Rehab  Date  03/02/18  Educator  CE  Instruction Review Code  1- Verbalizes Understanding      Sleep Hygiene: -Provides group verbal and written instruction about how sleep can affect your health.  Define sleep hygiene, discuss sleep cycles and impact of sleep habits. Review good sleep hygiene tips.    Cardiac Rehab from 03/07/2018 in [facility] Cardiac and Pulmonary Rehab  Date  01/24/18  Educator  [facility]  Instruction Review Code  1- Verbalizes Understanding  Other: -Provides group and verbal instruction on various topics (see comments)   Knowledge Questionnaire Score: Knowledge Questionnaire Score - 02/21/18 1027      Knowledge Questionnaire Score   Pre Score  21/28    Post Score  21/28 reviewed results with pt       Core Components/Risk Factors/Patient Goals at Admission: Personal Goals and Risk Factors at Admission - 12/08/17 1501      Core Components/Risk Factors/Patient Goals on Admission    Weight Management  Yes;Weight Maintenance    Intervention  Weight Management: Develop a combined nutrition and exercise program designed to reach desired caloric intake, while maintaining appropriate intake of nutrient and fiber, sodium and fats,  and appropriate energy expenditure required for the weight goal.;Weight Management: Provide education and appropriate resources to help participant work on and attain dietary goals.    Admit Weight  156 lb (70.8 kg)    Goal Weight: Long Term  156 lb (70.8 kg)    Expected Outcomes  Short Term: Continue to assess and modify interventions until short term weight is achieved;Long Term: Adherence to nutrition and physical activity/exercise program aimed toward attainment of established weight goal;Weight Maintenance: Understanding of the daily nutrition guidelines, which includes 25-35% calories from fat, 7% or less cal from saturated fats, less than '200mg'$  cholesterol, less than 1.5gm of sodium, & 5 or more servings of fruits and vegetables daily;Understanding recommendations for meals to include 15-35% energy as protein, 25-35% energy from fat, 35-60% energy from carbohydrates, less than '200mg'$  of dietary cholesterol, 20-35 gm of total fiber daily;Understanding of distribution of calorie intake throughout the day with the consumption of 4-5 meals/snacks    Heart Failure  Yes    Intervention  Provide a combined exercise and nutrition program that is supplemented with education, support and counseling about heart failure. Directed toward relieving symptoms such as shortness of breath, decreased exercise tolerance, and extremity edema.    Expected Outcomes  Improve functional capacity of life;Short term: Attendance in program 2-3 days a week with increased exercise capacity. Reported lower sodium intake. Reported increased fruit and vegetable intake. Reports medication compliance.;Short term: Daily weights obtained and reported for increase. Utilizing diuretic protocols set by physician.;Long term: Adoption of self-care skills and reduction of barriers for early signs and symptoms recognition and intervention leading to self-care maintenance.    Hypertension  Yes    Intervention  Provide education on lifestyle  modifcations including regular physical activity/exercise, weight management, moderate sodium restriction and increased consumption of fresh fruit, vegetables, and low fat dairy, alcohol moderation, and smoking cessation.;Monitor prescription use compliance.    Expected Outcomes  Short Term: Continued assessment and intervention until BP is < 140/58m HG in hypertensive participants. < 130/846mHG in hypertensive participants with diabetes, heart failure or chronic kidney disease.;Long Term: Maintenance of blood pressure at goal levels.    Lipids  Yes    Intervention  Provide education and support for participant on nutrition & aerobic/resistive exercise along with prescribed medications to achieve LDL '70mg'$ , HDL >'40mg'$ .    Expected Outcomes  Short Term: Participant states understanding of desired cholesterol values and is compliant with medications prescribed. Participant is following exercise prescription and nutrition guidelines.;Long Term: Cholesterol controlled with medications as prescribed, with individualized exercise RX and with personalized nutrition plan. Value goals: LDL < '70mg'$ , HDL > 40 mg.       Core Components/Risk Factors/Patient Goals Review:  Goals and Risk Factor Review    Row Name 12/22/17 1039 01/24/18 0859 02/14/18  [id number]         Core Components/Risk Factors/Patient Goals Review   Personal Goals Review  Weight Management/Obesity;Heart Failure;Hypertension;Lipids  Weight Management/Obesity;Heart Failure;Hypertension;Lipids  Lipids;Hypertension;Improve shortness of breath with ADL's     Review  Smokey has been doing well in rehab.  His blood pressures have been good.  He (or his wife) checks them frequently at home. His weight has been steady and he checks it daily at home.  He has not had any heart failure symptoms.  He is doing well with his medications and had a medication reconcilliation over the phone yesterday.  Gervis continues to do well in rehab.  His weight has been  staying steady around 155 lbs here and between 149-153 at home.  He and his wife monitor his weight and blood pressure daily as part of his heart failure routine. Recently, he has not had any symptoms of heart failure.  He feels that his medications are working well for him.   SOB has improved since starting HT.  Pt is taking all meds as directed.  Pt states he can be more active at home since starting to exercise      Expected Outcomes  Short: Continue to monitor heart failure symptoms daily.   Long: Continue to work on risk factors  Short: Continue to monitor weight daily to keep eye on heart failure.  Long: Continue to monitor risk factors.  Short - Falon will finish HT program Long - Pt will maintian healthy lifetyle choices        Core Components/Risk Factors/Patient Goals at Discharge (Final Review):  Goals and Risk Factor Review - 02/14/18 0935      Core Components/Risk Factors/Patient Goals Review   Personal Goals Review  Lipids;Hypertension;Improve shortness of breath with ADL's    Review  SOB has improved since starting HT.  Pt is taking all meds as directed.  Pt states he can be more active at home since starting to exercise     Expected Outcomes  Short - Lorne will finish HT program Long - Pt will maintian healthy lifetyle choices       ITP Comments: ITP Comments    Row Name 12/08/17 1437 01/03/18 1024 01/04/18 0637 01/05/18 0941 01/06/18 0727   ITP Comments  Med Review completed. Initial ITP created. Diagnosis can be found in Media Tab  Pt's wife called and left a message that Juandavid was still not feeling well and would miss class again today.   30 day review completed. Continue with ITP unless diercted changes per Medical Director.   3 visit this month  Will fax to [place] 4 beat run of vtach and little more short of breath this week he said  Addendum-ended up taking Brodey to [place] via wheelchair on 01/05/2018 for vtach and shortness of breath. See Emerg Dept  note:Plan: Patient had presented for dyspnea and PVCs seen in cardiac rehab. Patient's labs were within normal limits. 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  He has had occasional PVC here but no coupled beats or suggestions of V. tach.  These findings are chronic from the family's description.  He is cleared for outpatient follow-up with his cardiologist   Row Name 01/06/18 1514 02/01/18 0640 03/01/18 0600 03/07/18 0913     ITP Comments  Dr Lawana Pai from [facility] cardiology called to let us know that Caidyn is cleared to resume rehab on Tuesday as he checked out fine in the clinic.  He  was doing better today.  Dr. Lawana Pai said to call him with any further needs and he can be reached at [phone] ext [phone]  30 Day review. Continue with ITP unless directed changes per Medical Director review.    30 day review. Continue with ITP unless directed changes per Medical Director  Discharge ITP sent and signed by Dr. Sabra Heck.  Discharge Summary routed to PCP and cardiologist.       Comments: Discharge ITP

## 2018-03-07 NOTE — Progress Notes (Signed)
Discharge Progress Report  Patient Details  Name: Jared Castaneda MRN: [id number] Date of Birth: 1929-02-13 Referring Provider:     Cardiac Rehab from 12/08/2017 in [facility] Cardiac and Pulmonary Rehab  Referring Provider  [place]       Number of Visits: 36/36  Reason for Discharge:  Patient reached a stable level of exercise. Patient independent in their exercise. Patient has met program and personal goals.  Smoking History:  Social History   Tobacco Use  Smoking Status Former Smoker  . Last attempt to quit: 1984  . Years since quitting: 35.3  Smokeless Tobacco Never Used    Diagnosis:  Status post coronary artery stent placement  ADL UCSD:   Initial Exercise Prescription: Initial Exercise Prescription - 12/08/17 1400      Date of Initial Exercise RX and Referring Provider   Date  12/08/17    Referring Provider  P[place]oy      Treadmill   MPH  1    Grade  0    Minutes  15      NuStep   Level  1    SPM  80    Minutes  15    METs  1.3      Biostep-RELP   Level  1    SPM  50    Minutes  15    METs  1.3      Track   Laps  11    Minutes  15    METs  1.5      Prescription Details   Frequency (times per week)  3    Duration  Progress to 30 minutes of continuous aerobic without signs/symptoms of physical distress      Intensity   THRR 40-80% of Max Heartrate  89-119    Ratings of Perceived Exertion  11-13    Perceived Dyspnea  0-4      Resistance Training   Training Prescription  Yes    Weight  2 lb    Reps  10-15       Discharge Exercise Prescription (Final Exercise Prescription Changes): Exercise Prescription Changes - 02/21/18 1600      Response to Exercise   Blood Pressure (Admit)  142/60    Blood Pressure (Exercise)  155/70    Blood Pressure (Exit)  126/70    Heart Rate (Admit)  70 bpm    Heart Rate (Exercise)  105 bpm    Heart Rate (Exit)  59 bpm    Rating of Perceived Exertion (Exercise)  12    Symptoms  none    Duration  Continue  with 30 min of aerobic exercise without signs/symptoms of physical distress.    Intensity  THRR unchanged      Progression   Progression  Continue to progress workloads to maintain intensity without signs/symptoms of physical distress.    Average METs  3.06      Resistance Training   Training Prescription  Yes    Weight  3 lbs    Reps  10-15      Interval Training   Interval Training  No      NuStep   Level  6    Minutes  15    METs  3.8      Biostep-RELP   Level  4    Minutes  15    METs  2      Track   Laps  30    Minutes  15    METs  2.38      Home Exercise Plan   Plans to continue exercise at  Home (comment) walking    Frequency  Add 3 additional days to program exercise sessions.    Initial Home Exercises Provided  12/22/17       Functional Capacity: 6 Minute Walk    Row Name 12/08/17 1308 02/21/18 1025       6 Minute Walk   Phase  Initial  Discharge    Distance  742 feet  867 feet    Distance % Change  -  16.8 %    Distance Feet Change  -  125 ft    Walk Time  6 minutes  6 minutes    # of Rest Breaks  0  0    MPH  1.4  1.64    METS  1.36  1.85    RPE  13  12    Perceived Dyspnea   1  1    VO2 Peak  4.76  6.47    Symptoms  Yes (comment)  No    Comments  2/10 hip arthritis  used his cane  -    Resting HR  59 bpm  75 bpm    Resting BP  112/64  142/60    Resting Oxygen Saturation   98 % Room air  100 %    Exercise Oxygen Saturation  during 6 min walk  92 % Room air  100 %    Max Ex. HR  -  105 bpm    Max Ex. BP  136/56  154/70       Psychological, QOL, Others - Outcomes: PHQ 2/9: Depression screen [facility] 2/9 02/21/2018 12/08/2017  Decreased Interest 3 0  Down, Depressed, Hopeless 0 0  PHQ - 2 Score 3 0  Altered sleeping 0 3  Tired, decreased energy 2 1  Change in appetite 0 0  Feeling bad or failure about yourself  0 0  Trouble concentrating 0 0  Moving slowly or fidgety/restless 0 0  Suicidal thoughts 0 0  PHQ-9 Score 5 4  Difficult doing  work/chores Not difficult at all Not difficult at all    Quality of Life: Quality of Life - 02/21/18 1030      Quality of Life Scores   Health/Function Pre  20.87 %    Health/Function Post  24.8 %    Health/Function % Change  18.83 %    Socioeconomic Pre  21 %    Socioeconomic Post  30 %    Socioeconomic % Change   42.86 %    Psych/Spiritual Pre  21 %    Psych/Spiritual Post  30 %    Psych/Spiritual % Change  42.86 %    Family Pre  21 %    Family Post  28.8 %    Family % Change  37.14 %    GLOBAL Pre  20.94 %    GLOBAL Post  27.45 %    GLOBAL % Change  31.09 %       Personal Goals: Goals established at orientation with interventions provided to work toward goal. Personal Goals and Risk Factors at Admission - 12/08/17 1501      Core Components/Risk Factors/Patient Goals on Admission    Weight Management  Yes;Weight Maintenance    Intervention  Weight Management: Develop a combined nutrition and exercise program designed to reach desired caloric intake, while maintaining appropriate intake of nutrient and fiber, sodium and fats, and appropriate energy  expenditure required for the weight goal.;Weight Management: Provide education and appropriate resources to help participant work on and attain dietary goals.    Admit Weight  156 lb (70.8 kg)    Goal Weight: Long Term  156 lb (70.8 kg)    Expected Outcomes  Short Term: Continue to assess and modify interventions until short term weight is achieved;Long Term: Adherence to nutrition and physical activity/exercise program aimed toward attainment of established weight goal;Weight Maintenance: Understanding of the daily nutrition guidelines, which includes 25-35% calories from fat, 7% or less cal from saturated fats, less than '200mg'$  cholesterol, less than 1.5gm of sodium, & 5 or more servings of fruits and vegetables daily;Understanding recommendations for meals to include 15-35% energy as protein, 25-35% energy from fat, 35-60% energy from  carbohydrates, less than '200mg'$  of dietary cholesterol, 20-35 gm of total fiber daily;Understanding of distribution of calorie intake throughout the day with the consumption of 4-5 meals/snacks    Heart Failure  Yes    Intervention  Provide a combined exercise and nutrition program that is supplemented with education, support and counseling about heart failure. Directed toward relieving symptoms such as shortness of breath, decreased exercise tolerance, and extremity edema.    Expected Outcomes  Improve functional capacity of life;Short term: Attendance in program 2-3 days a week with increased exercise capacity. Reported lower sodium intake. Reported increased fruit and vegetable intake. Reports medication compliance.;Short term: Daily weights obtained and reported for increase. Utilizing diuretic protocols set by physician.;Long term: Adoption of self-care skills and reduction of barriers for early signs and symptoms recognition and intervention leading to self-care maintenance.    Hypertension  Yes    Intervention  Provide education on lifestyle modifcations including regular physical activity/exercise, weight management, moderate sodium restriction and increased consumption of fresh fruit, vegetables, and low fat dairy, alcohol moderation, and smoking cessation.;Monitor prescription use compliance.    Expected Outcomes  Short Term: Continued assessment and intervention until BP is < 140/63m HG in hypertensive participants. < 130/849mHG in hypertensive participants with diabetes, heart failure or chronic kidney disease.;Long Term: Maintenance of blood pressure at goal levels.    Lipids  Yes    Intervention  Provide education and support for participant on nutrition & aerobic/resistive exercise along with prescribed medications to achieve LDL '70mg'$ , HDL >'40mg'$ .    Expected Outcomes  Short Term: Participant states understanding of desired cholesterol values and is compliant with medications prescribed.  Participant is following exercise prescription and nutrition guidelines.;Long Term: Cholesterol controlled with medications as prescribed, with individualized exercise RX and with personalized nutrition plan. Value goals: LDL < '70mg'$ , HDL > 40 mg.        Personal Goals Discharge: Goals and Risk Factor Review    Row Name 12/22/17 1039 01/24/18 0859 02/14/18 0935         Core Components/Risk Factors/Patient Goals Review   Personal Goals Review  Weight Management/Obesity;Heart Failure;Hypertension;Lipids  Weight Management/Obesity;Heart Failure;Hypertension;Lipids  Lipids;Hypertension;Improve shortness of breath with ADL's     Review  ClTrapperas been doing well in rehab.  His blood pressures have been good.  He (or his wife) checks them frequently at home. His weight has been steady and he checks it daily at home.  He has not had any heart failure symptoms.  He is doing well with his medications and had a medication reconcilliation over the phone yesterday.  ClSaedontinues to do well in rehab.  His weight has been staying steady around 155 lbs here and between 149-153  at home.  He and his wife monitor his weight and blood pressure daily as part of his heart failure routine. Recently, he has not had any symptoms of heart failure.  He feels that his medications are working well for him.   SOB has improved since starting HT.  Pt is taking all meds as directed.  Pt states he can be more active at home since starting to exercise      Expected Outcomes  Short: Continue to monitor heart failure symptoms daily.   Long: Continue to work on risk factors  Short: Continue to monitor weight daily to keep eye on heart failure.  Long: Continue to monitor risk factors.  Short - Gary will finish HT program Long - Pt will maintian healthy lifetyle choices        Exercise Goals and Review: Exercise Goals    Row Name 12/08/17 1436             Exercise Goals   Increase Physical Activity  Yes        Intervention  Provide advice, education, support and counseling about physical activity/exercise needs.;Develop an individualized exercise prescription for aerobic and resistive training based on initial evaluation findings, risk stratification, comorbidities and participant's personal goals.       Expected Outcomes  Short Term: Attend rehab on a regular basis to increase amount of physical activity.;Long Term: Add in home exercise to make exercise part of routine and to increase amount of physical activity.;Long Term: Exercising regularly at least 3-5 days a week.       Increase Strength and Stamina  Yes       Intervention  Provide advice, education, support and counseling about physical activity/exercise needs.;Develop an individualized exercise prescription for aerobic and resistive training based on initial evaluation findings, risk stratification, comorbidities and participant's personal goals.       Expected Outcomes  Short Term: Increase workloads from initial exercise prescription for resistance, speed, and METs.;Short Term: Perform resistance training exercises routinely during rehab and add in resistance training at home;Long Term: Improve cardiorespiratory fitness, muscular endurance and strength as measured by increased METs and functional capacity (6MWT)       Able to understand and use rate of perceived exertion (RPE) scale  Yes       Intervention  Provide education and explanation on how to use RPE scale       Expected Outcomes  Short Term: Able to use RPE daily in rehab to express subjective intensity level;Long Term:  Able to use RPE to guide intensity level when exercising independently       Able to understand and use Dyspnea scale  Yes       Intervention  Provide education and explanation on how to use Dyspnea scale       Expected Outcomes  Short Term: Able to use Dyspnea scale daily in rehab to express subjective sense of shortness of breath during exertion;Long Term: Able to use  Dyspnea scale to guide intensity level when exercising independently       Knowledge and understanding of Target Heart Rate Range (THRR)  Yes       Intervention  Provide education and explanation of THRR including how the numbers were predicted and where they are located for reference       Expected Outcomes  Long Term: Able to use THRR to govern intensity when exercising independently;Short Term: Able to state/look up THRR;Short Term: Able to use daily as guideline for intensity in rehab  Able to check pulse independently  Yes       Intervention  Provide education and demonstration on how to check pulse in carotid and radial arteries.;Review the importance of being able to check your own pulse for safety during independent exercise       Expected Outcomes  Short Term: Able to explain why pulse checking is important during independent exercise;Long Term: Able to check pulse independently and accurately       Understanding of Exercise Prescription  Yes       Intervention  Provide education, explanation, and written materials on patient's individual exercise prescription       Expected Outcomes  Short Term: Able to explain program exercise prescription;Long Term: Able to explain home exercise prescription to exercise independently          Nutrition & Weight - Outcomes: Pre Biometrics - 12/08/17 1435      Pre Biometrics   Height  5' 9.5" (1.765 m)    Weight  156 lb (70.8 kg)    Waist Circumference  36.5 inches    Hip Circumference  39.5 inches    Waist to Hip Ratio  0.92 %    BMI (Calculated)  22.71      Post Biometrics - 02/21/18 1027       Post  Biometrics   Height  5' 9.5" (1.765 m)    Weight  155 lb (70.3 kg)    Waist Circumference  35 inches    Hip Circumference  38 inches    Waist to Hip Ratio  0.92 %    BMI (Calculated)  22.57       Nutrition: Nutrition Therapy & Goals - 12/22/17 1038      Nutrition Therapy   Diet  DASH    Drug/Food Interactions  Statins/Certain  Fruits    Protein (specify units)  8    Fiber  30 grams    Whole Grain Foods  2 servings    Saturated Fats  14 max. grams    Fruits and Vegetables  5 servings/day    Sodium  1500 grams      Personal Nutrition Goals   Nutrition Goal  No more than '600mg'$  of sodium per meal / frozen meal    Personal Goal #2  Keep sodium below 200-'300mg'$  for snacks    Personal Goal #3  Choose whole grain foods at least half the time, brown rice for example    Personal Goal #4  Continue to practice reading nutrition facts labels consistently to identify foods high in sodium and fat      Intervention Plan   Intervention  Prescribe, educate and counsel regarding individualized specific dietary modifications aiming towards targeted core components such as weight, hypertension, lipid management, diabetes, heart failure and other comorbidities.    Expected Outcomes  Short Term Goal: Understand basic principles of dietary content, such as calories, fat, sodium, cholesterol and nutrients.;Short Term Goal: A plan has been developed with personal nutrition goals set during dietitian appointment.;Long Term Goal: Adherence to prescribed nutrition plan.       Nutrition Discharge: Nutrition Assessments - 02/21/18 1029      MEDFICTS Scores   Pre Score  29    Post Score  12    Score Difference  -17       Education Questionnaire Score: Knowledge Questionnaire Score - 02/21/18 1027      Knowledge Questionnaire Score   Pre Score  21/28    Post Score  21/28 reviewed  results with pt       Goals reviewed with patient; copy given to patient.

## 2018-03-07 NOTE — Progress Notes (Signed)
Daily Session Note  Patient Details  Name: Kylor Valverde MRN: [id number] Date of Birth: 02-19-1929 Referring Provider:     Cardiac Rehab from 12/08/2017 in [facility] Cardiac and Pulmonary Rehab  Referring Provider  [place]      Encounter Date: 03/07/2018  Check In: Session Check In - 03/07/18 0912      Check-In   Location  [facility]-Cardiac & Pulmonary Rehab    Staff Present  Heath Lark, RN, BSN, CCRP;Mackenze Grandison Gary, MA, RCEP, CCRP, Exercise Physiologist;Amanda Oletta Darter, [place], ACSM CEP, Exercise Physiologist    Supervising physician immediately available to respond to emergencies  See telemetry face sheet for immediately available ER MD    Medication changes reported      No    Fall or balance concerns reported     No    Warm-up and Cool-down  Performed on first and last piece of equipment    Resistance Training Performed  Yes    VAD Patient?  No      Pain Assessment   Currently in Pain?  No/denies          Social History   Tobacco Use  Smoking Status Former Smoker  . Last attempt to quit: 1984  . Years since quitting: 35.3  Smokeless Tobacco Never Used    Goals Met:  Independence with exercise equipment Exercise tolerated well No report of cardiac concerns or symptoms Strength training completed today  Goals Unmet:  Not Applicable  Comments:  Yaxiel graduated today from  rehab with 36 sessions completed.  Details of the patient's exercise prescription and what He needs to do in order to continue the prescription and progress were discussed with patient.  Patient was given a copy of prescription and goals.  Patient verbalized understanding.  Joziyah plans to continue to exercise by walking and joining the [place].    Dr. Emily Filbert is Medical Director for [facility] and [facility].

## 2018-12-25 ENCOUNTER — Ambulatory Visit: Payer: Non-veteran care

## 2018-12-28 ENCOUNTER — Ambulatory Visit: Payer: Non-veteran care

## 2019-09-11 IMAGING — CR DG CHEST 2V
2 series · 2 of 2 positions shown · non-contrast
Comparison: None.

CLINICAL DATA: Shortness of breath.

EXAM:
CHEST  2 VIEW

[chest pa]
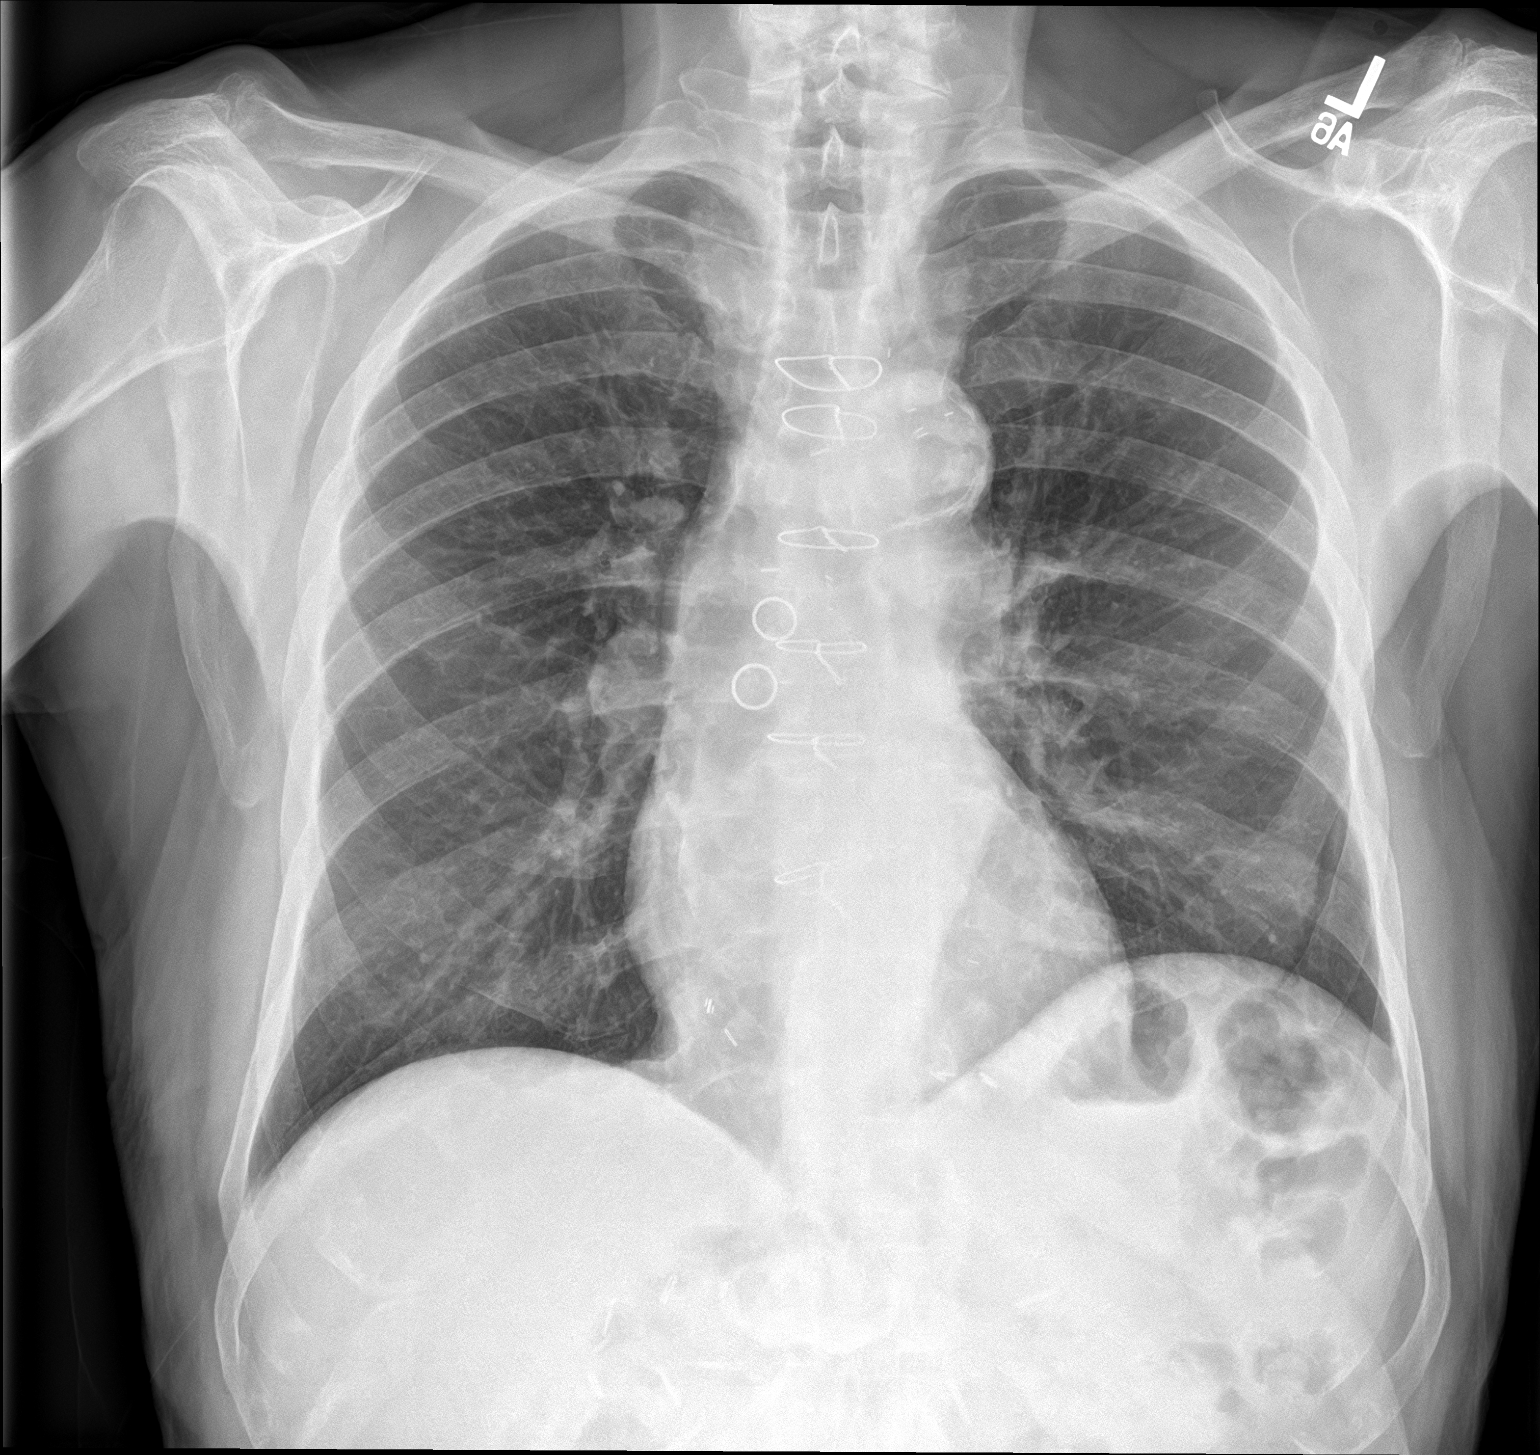

[chest lat]
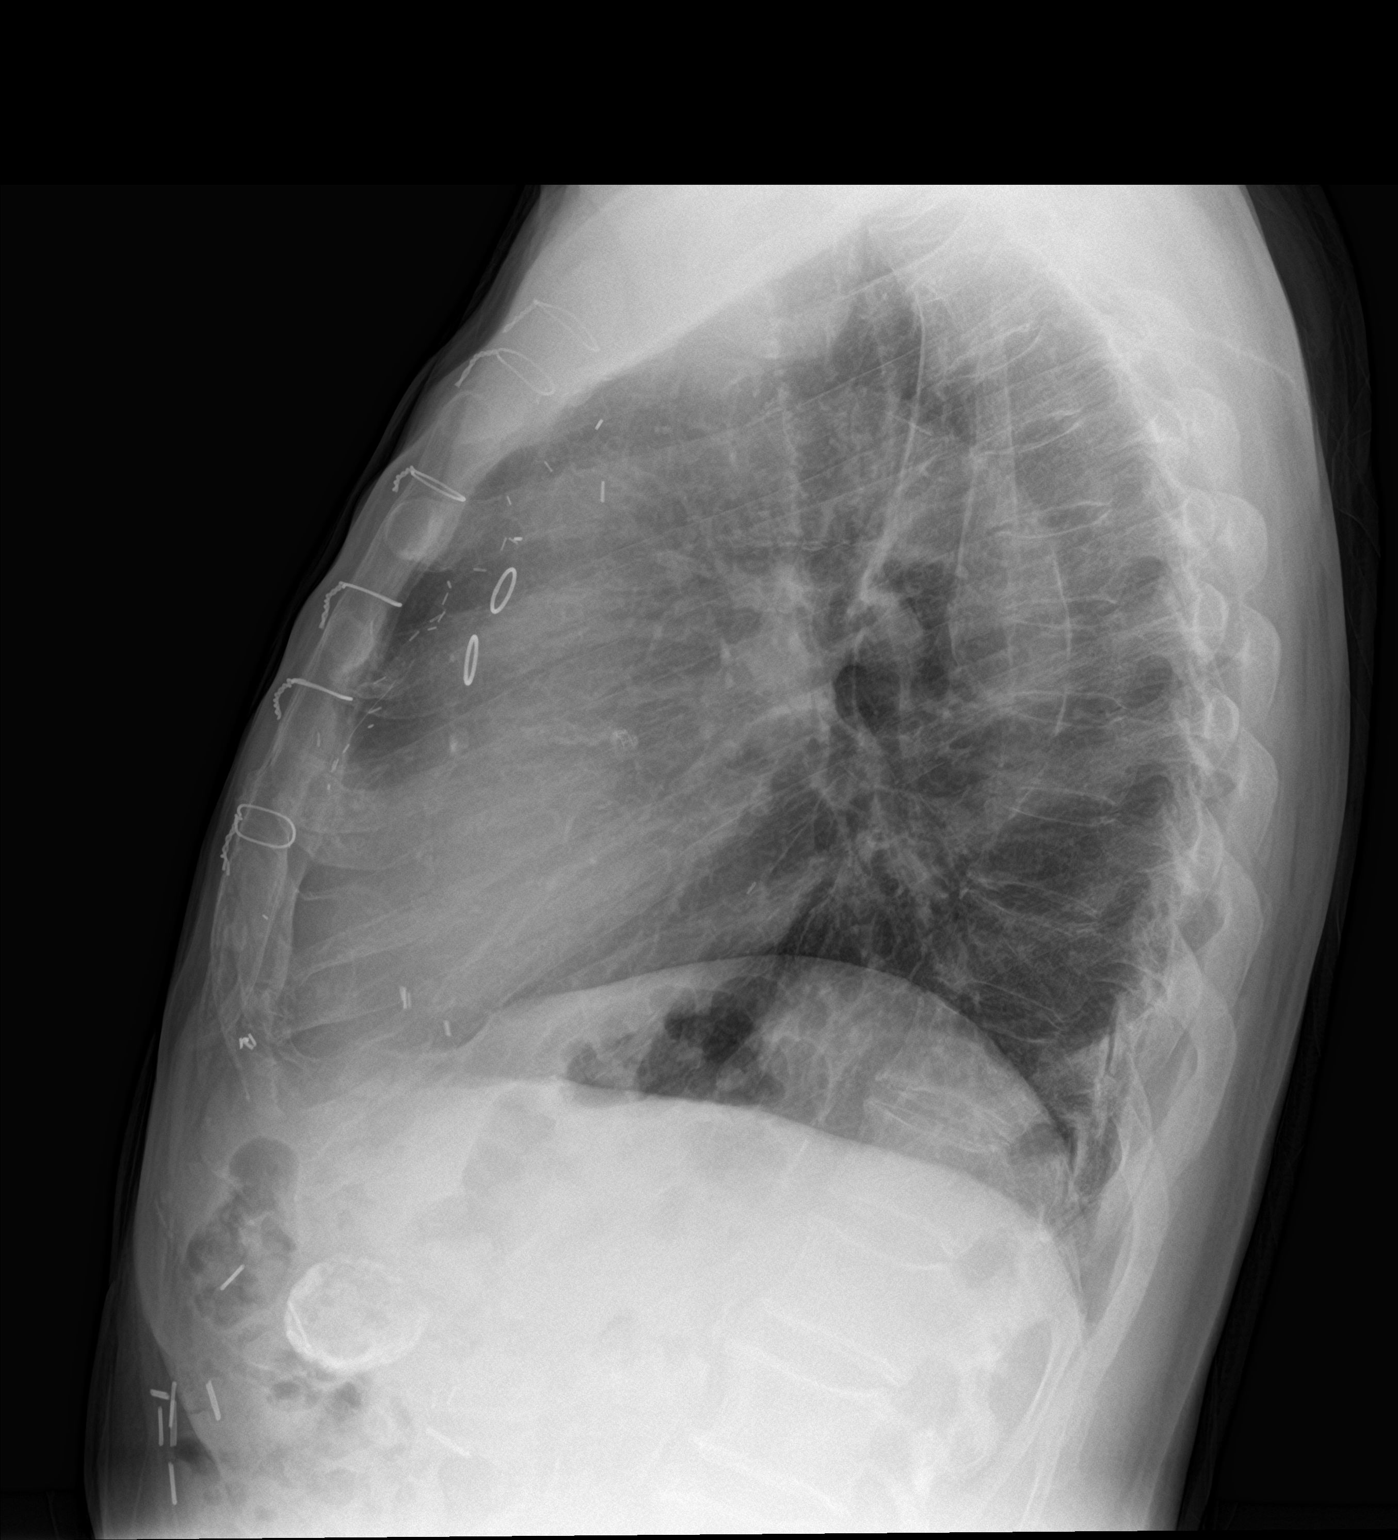

[2 of 2 positions shown; findings below may reference images not displayed]

FINDINGS: The heart size and mediastinal contours are within normal limits.
Atherosclerosis of thoracic aorta is noted. Status post coronary
artery bypass graft. No pneumothorax or pleural effusion is noted.
Both lungs are clear. The visualized skeletal structures are
unremarkable. Large rounded calcification is seen anteriorly in the
epigastric region of the abdomen.
IMPRESSION: No active cardiopulmonary disease.

Large rounded calcification is seen anteriorly in epigastric region
of the abdomen; CT scan of the abdomen with intravenous contrast is
recommended to rule out possible aneurysm.

Aortic Atherosclerosis (ONUEL-GJP.P).

## 2019-09-11 IMAGING — CT CT ANGIO CHEST-ABD-PELV FOR DISSECTION W/ AND WO/W CM
2 of 7 series · 11 of 36 positions shown, 15 images · IV contrast (APPLIED)
Comparison: 01/05/2018 chest radiograph

CLINICAL DATA: 88-year-old male with acute on chronic shortness of
breath and acute ventricular tachycardia. History of CLL,
cholecystectomy and CABG.

EXAM:
CT ANGIOGRAPHY CHEST, ABDOMEN AND PELVIS
TECHNIQUE: Multidetector CT imaging through the chest, abdomen and pelvis was
performed using the standard protocol during bolus administration of
intravenous contrast. Multiplanar reconstructed images and MIPs were
obtained and reviewed to evaluate the vascular anatomy.
CONTRAST:  100mL BB4T5M-05J IOPAMIDOL ([id number]) INJECTION 76%

[Series 6: axial arterial · axial · arterial · 0.66mm/px · z∈[-997,-457]mm · 10 of 210 slices shown, 13 images]
[im 15/210  mediastinal]
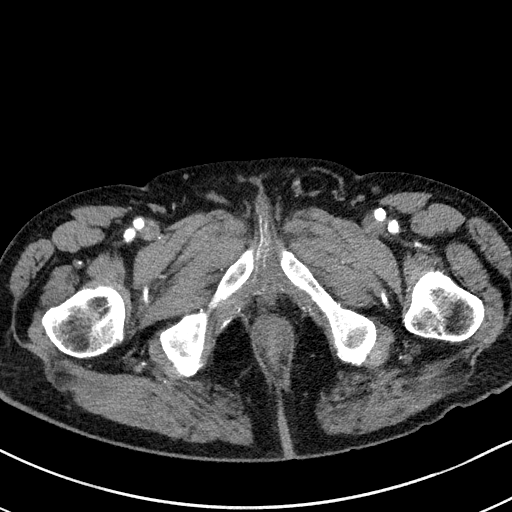
[im 15/210  bone]
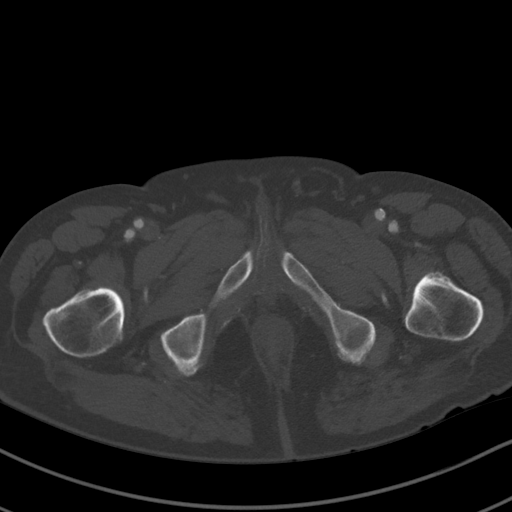
[im 45/210  mediastinal]
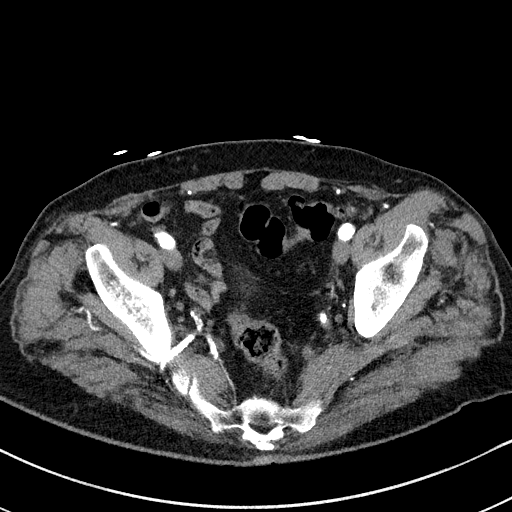
[im 75/210  mediastinal]
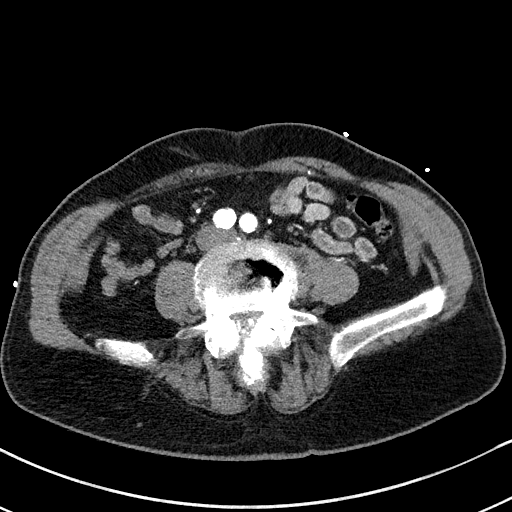
[im 90/210  mediastinal]
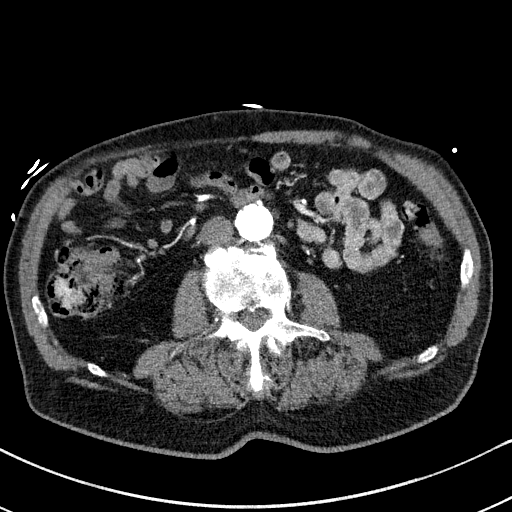
[im 120/210  mediastinal]
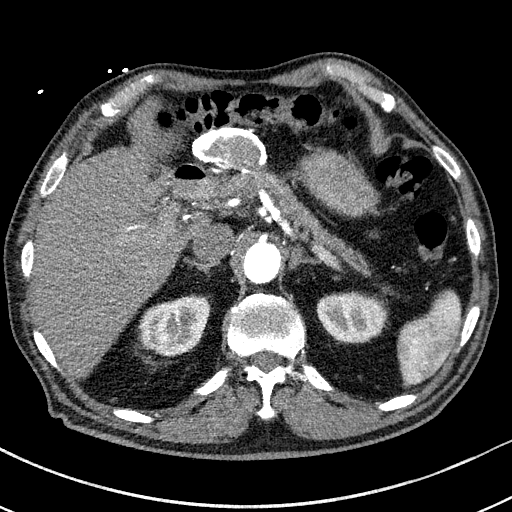
[im 135/210  mediastinal]
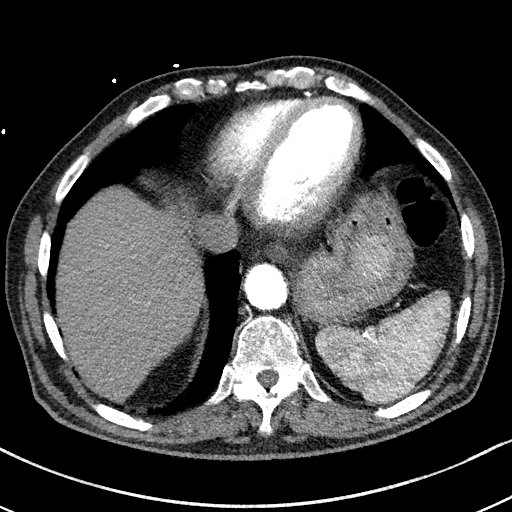
[im 150/210  lung]
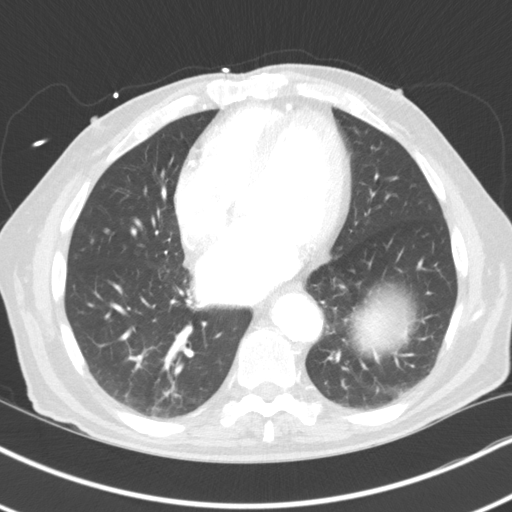
[im 165/210  mediastinal]
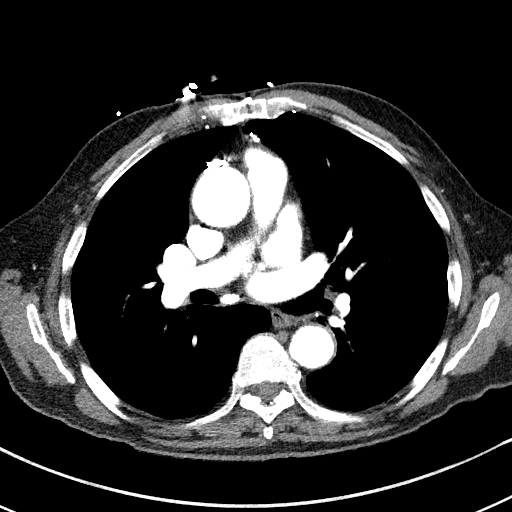
[im 165/210  lung]
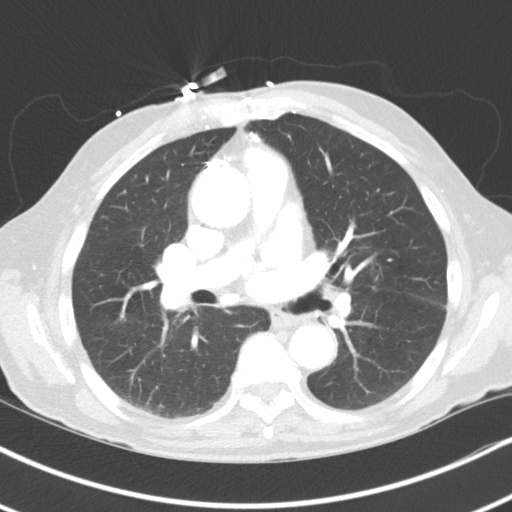
[im 180/210  lung]
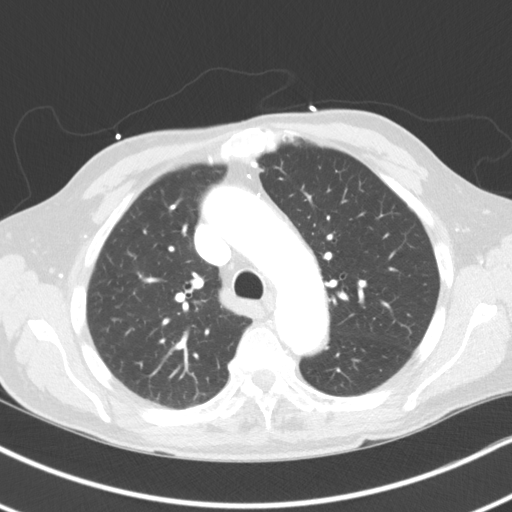
[im 195/210  mediastinal]
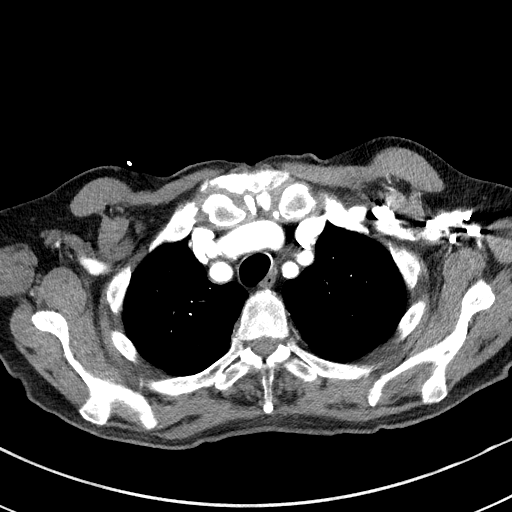
[im 195/210  lung]
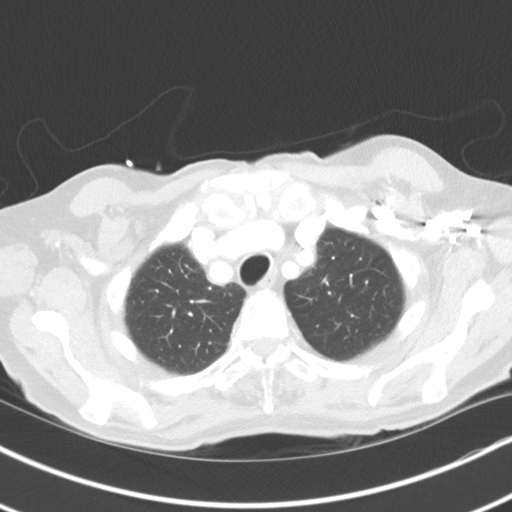

[Series 8: coronals · coronal · 0.69mm/px · 1 of 139 slices shown, 2 images]
[im 70/139  mediastinal]
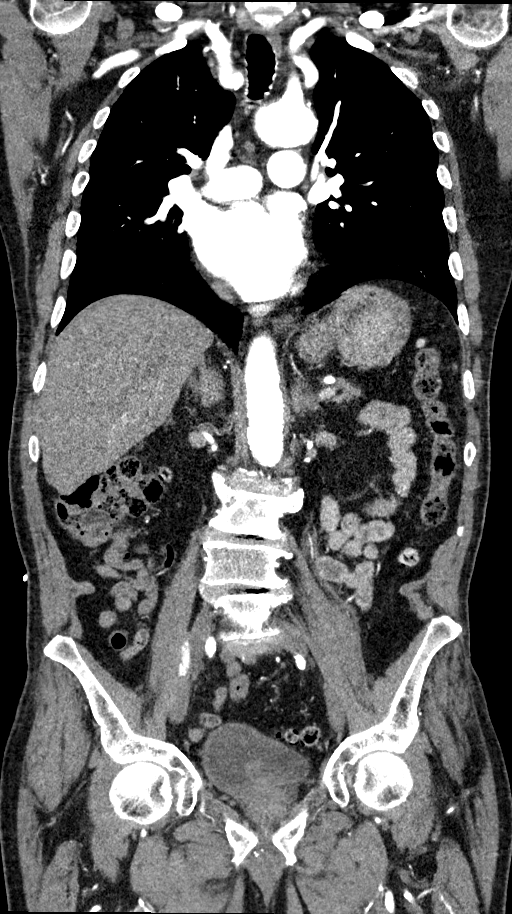
[im 70/139  bone]
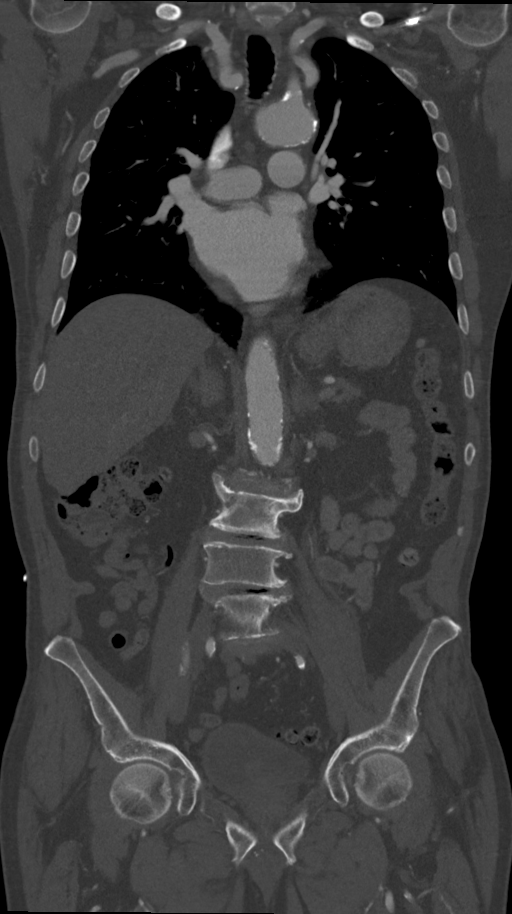

[11 of 36 positions shown; findings below may reference images not displayed]

FINDINGS: CTA CHEST FINDINGS

Cardiovascular: Cardiomegaly noted with LEFT atrial enlargement.
There is fusiform ectasia of the entire thoracic aorta without
evidence of aneurysm or dissection. The ascending aorta measures up
to 3.7 cm in diameter. No large or central pulmonary emboli are
identified. Coronary artery and aortic atherosclerotic
calcifications are present. Evidence of previous cardiac
surgery/CABG noted. No pericardial effusion.

Mediastinum/Nodes: No enlarged mediastinal, hilar, or axillary lymph
nodes. No mediastinal mass or significant findings.

Lungs/Pleura: Subsegmental atelectasis/scar with within the LEFT
LOWER lobe noted. There is no evidence of airspace disease,
consolidation, mass, nodule, pleural effusion or pneumothorax.

Musculoskeletal: No acute bony abnormality or suspicious bony
lesion.

Review of the MIP images confirms the above findings.

CTA ABDOMEN AND PELVIS FINDINGS

VASCULAR

Abdominal aortic atherosclerotic calcifications noted without
evidence of aneurysm or dissection. Atherosclerotic calcification of
the major branch vessels noted.

The visualized mesenteric and renal arteries are patent.

No other significant abnormalities identified.

Review of the MIP images confirms the above findings.

NON-VASCULAR

Hepatobiliary: The liver is unremarkable. The patient is status post
cholecystectomy. No biliary dilatation.

Pancreas: No significant pancreatic abnormalities. A 2.5 x 5 cm rim
calcified structure anterior to the pancreas could represent a
calcified pseudocyst or duplication cyst. This is likely
chronic/remote and of probable little clinical significance

Spleen: No significant abnormalities

Adrenals/Urinary Tract: The kidneys, adrenal glands and bladder are
unremarkable.

Stomach/Bowel: Stomach is within normal limits. Appendix appears
normal. No evidence of bowel wall thickening, distention, or
inflammatory changes.

Lymphatic: No enlarged lymph nodes identified.

Reproductive: Mild prostate enlargement noted.

Other: No ascites, focal collection or pneumoperitoneum.

Musculoskeletal: Moderate to severe degenerative changes from L3-S1
noted. No acute bony abnormality identified..

Review of the MIP images confirms the above findings.
IMPRESSION: 1. No evidence of thoracic or abdominal aortic aneurysm or
dissection. Ectasia of the thoracic aorta with the ascending
thoracic aorta measuring up to 3.7 cm in greatest diameter.
2. No evidence of central or large pulmonary emboli.
3. LEFT LOWER lobe subsegmental atelectasis versus scar
4. Cardiomegaly with CABG changes.
5. Mild prostate enlargement
6. Moderate severe degenerative changes in the LOWER lumbar spine
7.  Aortic Atherosclerosis (DTVST-QVI.I).

## 2019-11-09 DEATH — deceased
